# Patient Record
Sex: Male | Born: 1955 | Race: White | Hispanic: No | Marital: Married | Smoking: Former smoker
Health system: Southern US, Community
[De-identification: ages and names within clinical notes are randomized; demographics above are authoritative.]

## PROBLEM LIST (undated history)

## (undated) DIAGNOSIS — N400 Enlarged prostate without lower urinary tract symptoms: Secondary | ICD-10-CM

## (undated) DIAGNOSIS — E669 Obesity, unspecified: Secondary | ICD-10-CM

## (undated) DIAGNOSIS — J45909 Unspecified asthma, uncomplicated: Secondary | ICD-10-CM

## (undated) HISTORY — PX: NASAL SINUS SURGERY: SHX719

## (undated) HISTORY — DX: Obesity, unspecified: E66.9

## (undated) HISTORY — PX: HERNIA REPAIR: SHX51

## (undated) HISTORY — PX: CHOLECYSTECTOMY: SHX55

## (undated) HISTORY — DX: Unspecified asthma, uncomplicated: J45.909

## (undated) HISTORY — DX: Benign prostatic hyperplasia without lower urinary tract symptoms: N40.0

## (undated) HISTORY — PX: TRIGGER FINGER RELEASE: SHX641

---

## 2000-06-02 ENCOUNTER — Encounter: Payer: Self-pay | Admitting: Family Medicine

## 2000-06-02 ENCOUNTER — Ambulatory Visit (HOSPITAL_COMMUNITY): Admission: RE | Admit: 2000-06-02 | Discharge: 2000-06-02 | Payer: Self-pay | Admitting: Family Medicine

## 2001-01-23 ENCOUNTER — Encounter: Payer: Self-pay | Admitting: Otolaryngology

## 2001-01-23 ENCOUNTER — Encounter: Admission: RE | Admit: 2001-01-23 | Discharge: 2001-01-23 | Payer: Self-pay | Admitting: Otolaryngology

## 2001-02-12 ENCOUNTER — Other Ambulatory Visit: Admission: RE | Admit: 2001-02-12 | Discharge: 2001-02-12 | Payer: Self-pay | Admitting: Otolaryngology

## 2001-02-12 ENCOUNTER — Encounter (INDEPENDENT_AMBULATORY_CARE_PROVIDER_SITE_OTHER): Payer: Self-pay | Admitting: Specialist

## 2002-07-19 ENCOUNTER — Ambulatory Visit (HOSPITAL_COMMUNITY): Admission: RE | Admit: 2002-07-19 | Discharge: 2002-07-19 | Payer: Self-pay | Admitting: Family Medicine

## 2002-07-19 ENCOUNTER — Encounter: Payer: Self-pay | Admitting: Family Medicine

## 2002-07-28 ENCOUNTER — Encounter: Payer: Self-pay | Admitting: Neurosurgery

## 2002-07-28 ENCOUNTER — Inpatient Hospital Stay (HOSPITAL_COMMUNITY): Admission: RE | Admit: 2002-07-28 | Discharge: 2002-07-29 | Payer: Self-pay | Admitting: Neurosurgery

## 2003-02-16 ENCOUNTER — Encounter: Payer: Self-pay | Admitting: Urology

## 2003-02-16 ENCOUNTER — Ambulatory Visit (HOSPITAL_COMMUNITY): Admission: RE | Admit: 2003-02-16 | Discharge: 2003-02-16 | Payer: Self-pay | Admitting: Urology

## 2007-10-04 ENCOUNTER — Inpatient Hospital Stay (HOSPITAL_COMMUNITY): Admission: EM | Admit: 2007-10-04 | Discharge: 2007-10-07 | Payer: Self-pay | Admitting: *Deleted

## 2007-10-06 ENCOUNTER — Encounter (INDEPENDENT_AMBULATORY_CARE_PROVIDER_SITE_OTHER): Payer: Self-pay | Admitting: Surgery

## 2011-04-30 NOTE — Op Note (Signed)
NAMEJOJO, PEHL NO.:  [id number]   MEDICAL RECORD NO.:  [id number]          PATIENT TYPE:  INP   LOCATION:  5703                         FACILITY:  [facility]   PHYSICIAN:  Maisie Fus A. Cornett, M.D.DATE OF BIRTH:  [birth year]   DATE OF PROCEDURE:  10/06/2007  DATE OF DISCHARGE:                               OPERATIVE REPORT   PREOPERATIVE DIAGNOSIS:  Acute cholecystitis.   POSTOPERATIVE DIAGNOSIS:  Acute cholecystitis.   PROCEDURE:  Laparoscopic cholecystectomy with cholangiogram.   SURGEON:  Thomas A. Cornett, M.D.   ASSISTANT:  Adolph Pollack, M.D.   ANESTHESIA:  General endotracheal anesthesia, 0.25% Sensorcaine local.   ESTIMATED BLOOD LOSS:  150 mL.   DRAIN:  A 19-Blake drain to gallbladder fossa.   INDICATIONS FOR PROCEDURE:  The patient is a 55 year old male admitted  early Monday morning with acute cholecystitis.  He presents today for  laparoscopic cholecystectomy for acute cholecystitis.   DESCRIPTION OF PROCEDURE:  The patient is brought to the operating room  and placed supine.  After induction of general anesthesia, the abdomen  was prepped and draped in a sterile fashion.  A 1 cm infraumbilical  incision was made after infiltration with local anesthesia.  Dissection  was carried down to his fascia.  The fascia was opened with the scalpel  blade.  Kochers were used to grab the fascial edges, pulled them up and  a small hemostat was used to open the peritoneal lining.  A pursestring  suture of 0 Vicryl was placed and the 12 mm Hasson cannula was placed  under direct vision.  The pneumoperitoneum was created to 15 mmHg of CO2  and a laparoscope was placed.  Laparoscopy was performed.  He was placed  in the reverse Trendelenburg and rolled to his left.  There was dense  omental adhesions to the acutely inflamed gallbladder.  We were able to  identify the dome of the gallbladder, pull the omental adhesions away  and grasper was used to grab  the infundibulum and pull it to the  patient's right lower quadrant.  The cystic duct was identified,  dissected out circumferentially.  It was the only tubular structure  entering the gallbladder and the critical angle was achieved at this  point.  Two clips were placed in the gallbladder site and the duct was  then opened using scissors.  A cholangiogram catheter was introduced  through a separate stab wound, placed in the cystic duct to control  bile.  Intraoperative cholangiogram was performed which showed flow  through the cystic duct into the common bile duct and through the  duodenum without signs of obstruction.  There was free flow up the  common hepatic duct, which was short, and then bifurcation of right and  left hepatic ducts was noted.  No evidence of extravasation, stone or  leakage noted on cholangiogram.  The catheter was then removed.  The  cystic duct stump was triple clipped and divided.  The cystic artery was  taken in between clips as well.  There is a posterior branch that we  controlled with clips as  well from the same artery going into the  gallbladder.  We then used the cautery to dissect the gallbladder from  the gallbladder fossa.  The gallbladder was quite necrotic and began to  separate.  There was some spillage of stones.  I was able to suction  these under direct vision and any stone that we were able to see  directly and removed with the suction quite easily.  Once the entire  gallbladder was removed from the gallbladder fossa, it was placed in the  EndoCatch bag.  We then irrigated out the gallbladder fossa and used  cautery to control oozing, which was significant.  Surgicel was placed  in the gallbladder fossa.  Clips were on both the cystic artery and  cystic duct stump without signs of bleeding.  There was some oozing from  the omentum we controlled with electrocautery.  Next, we placed a 19  Blake drain due to the amount of inflammation and oozing  noted from the  gallbladder bed and the gallbladder fossa and pulled this out through  the most lateral 5-mm port.  Of note, a subxiphoid 10-mm port and two 5-  mm ports were placed that I placed under direct vision, the larger in  the subxiphoid and the two 5-mm in the right upper quadrant.  At this  point in time, the gallbladder was then placed in the EndoCatch bag and  extracted through the umbilicus.  Unfortunately, the bag broke as we got  the dilator in the subcutaneous tissues, but removed the camera looking  at it directly and no stones spilled down to the umbilicus.  We were  able then to carefully extract the gallbladder by opening our fascial  opening and removed the gallbladder without difficulty.  There was one  small retained clip, we removed it.  No evidence of any stones,  gallbladder or bag being left behind upon inspection with the  laparoscope and on direct vision.  Next, we suctioned out all excess  irrigation and found that hemostasis was excellent.  We then closed the  fascial defect with 0 Vicryl.  This was at the umbilicus.  We then  reexamined the right upper quadrant, suctioned out excess fluid, found  hemostasis continued to be excellent, the drain to be in good position  and we removed our ports and allowed the CO2 to escape.  Once all ports  were removed, we irrigated out the umbilicus and closed all incisions  with 4-0 Monocryl.  Dermabond was applied to all skin incisions.  All  final counts of sponge, needle and instruments found to be correct in  this portion of the case.  The patient was then awoke, taken to recovery  in satisfactory condition.      Thomas A. Cornett, M.D.  Electronically Signed     TAC/MEDQ  D:  10/06/2007  T:  10/06/2007  Job:  [id number]

## 2011-04-30 NOTE — H&P (Signed)
[name], NUNNERY NO.:  [id number]   MEDICAL RECORD NO.:  [id number]          PATIENT TYPE:  EMS   LOCATION:  MAJO                         FACILITY:  [facility]   PHYSICIAN:  Gabrielle Dare. Janee Morn, M.D.DATE OF BIRTH:  [birth year]   DATE OF ADMISSION:  10/04/2007  DATE OF DISCHARGE:                              HISTORY & PHYSICAL   CHIEF COMPLAINT:  Right upper quadrant abdominal pain.   HISTORY OF PRESENT ILLNESS:  [name] is a 55 year old gentleman who  developed acute onset of right upper quadrant abdominal pain around  midnight last night.  The pain persisted.  He has had similar episodes  in the past, though they were milder and lasted only a short period of  time.  This time the pain persisted despite him taking a hot shower.  He  had some associated dyspepsia.  He came to the [facility] Emergency  Department for further evaluation.  A workup here demonstrates elevated  white blood cell count and ultrasound findings consistent with  cholecystitis, and we are asked to evaluate.   PAST MEDICAL HISTORY:  1. GERD.  2. Seasonal allergies.   PAST SURGICAL HISTORY:  1. Left inguinal hernia repair in 1985.  2. L4-5 back surgery by Dr. Newell Coral in 2003.   FAMILY HISTORY:  Mother and sister have had gallbladder disease.   SOCIAL HISTORY:  He does not smoke.  He does not drink alcohol.  He  works with [employer].   ALLERGIES:  NO KNOWN DRUG ALLERGIES.   MEDICATIONS:  Allegra and Mucinex.   REVIEW OF SYSTEMS:  Significant for the GI system as above.  Remainder  of the review of the systems was noncontributory.   PHYSICAL EXAM:  VITAL SIGNS:  Temperature 98.4, pulse 71, respirations  18, blood pressure 138/94.  GENERAL:  He is awake and alert.  He is in no distress.  HEENT:  Pupils are equal and reactive.  Sclerae is clear with no  icterus.  Oral mucosa is moist.  NECK:  Supple with no tenderness.  LUNGS:  Clear to auscultation.  RESPIRATORY:  Excursion is  good.  CARDIOVASCULAR EXAM:  Heart is regular.  No murmurs are heard.  Pulse is  palpable in the left chest.  ABDOMEN:  Moderate tenderness to palpation in the right upper quadrant.  There is no guarding.  Bowel sounds are present.  No masses are felt.  Inguinal exam reveals no hernias at this time.  EXTREMITIES:  Warm with no deformity.  SKIN:  Warm and dry.  No rashes are present.  NEUROLOGIC EXAM:  There is no focal deficits.   LABORATORY STUDIES AND X-RAYS:  Ultrasound of gallbladder shows  gallstones, gallbladder wall thickening, and some pericholecystic fluid  consistent with acute cholecystitis.  White blood cell count 22.7,  hemoglobin 15.8, platelets 251.  Lipase 17, AST 28, ALT 33, alkaline  phosphatase 85, bilirubin 0.8.  Sodium 135, potassium 3.2, chloride 100,  CO2 of 25, BUN 4, creatinine 0.96, glucose 106.  Urinalysis negative.   IMPRESSION:  1. Acute cholecystitis.  2. Hypokalemia.   PLAN:  Admit him.  We will give him IV fluids and IV antibiotics.  We  will replace his potassium and plan laparoscopic cholecystectomy this  admission.      Gabrielle Dare Janee Morn, M.D.  Electronically Signed     BET/MEDQ  D:  10/04/2007  T:  10/05/2007  Job:  [id number]

## 2011-05-03 NOTE — Discharge Summary (Signed)
NAMEDONTAI, [name] NO.:  [id number]   MEDICAL RECORD NO.:  [id number]          PATIENT TYPE:  INP   LOCATION:  5703                         FACILITY:  [facility]   PHYSICIAN:  [name], M.D.DATE OF BIRTH:  1956/10/01   DATE OF ADMISSION:  10/04/2007  DATE OF DISCHARGE:  10/07/2007                               DISCHARGE SUMMARY   DISCHARGING PHYSICIAN:  [name], M.D.   CHIEF COMPLAINT AND REASON FOR ADMISSION:  This is a 50-year male  patient with a history of gastrointestinal reflux disease who developed  the acute onset of right upper quadrant abdominal pain the night before  he presented to the ER.  He has had prior episodes in the past, but not  as severe as this episode.  He presented to the [facility] ER where he was  found to be afebrile on clinical exam and he was tender in the right  upper quadrant concerning for possible cholecystitis.  His white blood  cell count was elevated at 22,700.  Lipase was 17.  LFTs were normal  with a normal total bilirubin 0.8.  An ultrasound was performed that  showed gallstones, gallbladder wall thickening and pericholecystic fluid  consistent with acute cholecystitis.   ADMITTING DIAGNOSES:  The patient subsequently admitted with the  following diagnoses.  1. Acute cholecystitis.  2. Hypokalemia with a potassium of 3.2 secondary to nausea and      vomiting.   HOSPITAL COURSE:  The patient was admitted and was taken directly from  the ER to the OR where he subsequently underwent a laparoscopic  cholecystectomy by Dr. Marca Singh.  The patient tolerated procedure  well.   By postoperative day #1, on October 07, 2007, he was tolerating full  liquids with plans to advance his diet advance oral pain medications;  and, if this was tolerated we would send the patient home.  He did have  a JP drain placed because of bleeding and the intraoperative procedure,  noting that the OR procedure was done on October 06, 2007.  The JP drain  remained in place and the patient was sent home.  The patient did  tolerate diet and oral pain medication was advanced; and he was  otherwise deemed appropriate for discharge home.   DISCHARGE DIAGNOSES:  1. Abdominal pain in the right upper quadrant secondary to acute      cholecystitis without obstructive pattern.  2. Status post laparoscopic cholecystectomy.   DISCHARGE MEDICATIONS:  The patient will resume the following home  medications.  1. Allegra twice a day.  2. Optiva eye drops.   New medications include:  1. Percocet 5/325 one to two tabs every 4 hours as needed for pain.  2. Over-the-counter ibuprofen 2-3 tablets every 8 hours as needed in      addition to the Percocet for pain; please take with food.   WOUND CARE:  The patient is to empty the JP drain two to three times  daily.  He is to record the amount of bring this documentation to follow  up with Dr. Marca Singh.  RETURN TO WORK:  The patient may return to work in 2 weeks; note given   DIET:  No restrictions.   ACTIVITY:  Increase activity slowly.  He may walk up steps and sponge  bathe while the drain is in place.  No lifting greater than 15 pounds  for the next 2 weeks.  No driving for 1 week.   FOLLOW-UP APPOINTMENTS:  The patient has an appointment with Dr. Luisa Singh  on October 12, 2007 2:30 P.M.   ADDITIONAL INSTRUCTIONS:  The patient is to call the surgeon if he  develops a fever greater than 101 degrees Fahrenheit, had new or  increased belly pain, redness or drainage from his wounds, nausea,  vomiting, or diarrhea.      [name], N.P.      [name], M.D.  Electronically Signed    ALE/MEDQ  D:  11/04/2007  T:  11/05/2007  Job:  [id number]

## 2011-05-03 NOTE — Op Note (Signed)
NAME:  [name], PIENTA                         ACCOUNT NO.:  [id number]   MEDICAL RECORD NO.:  [id number]                   PATIENT TYPE:  INP   LOCATION:  3019                                 FACILITY:  [facility]   PHYSICIAN:  Hewitt Shorts, M.D.            DATE OF BIRTH:  1956-07-24   DATE OF PROCEDURE:  07/28/2002  DATE OF DISCHARGE:  07/29/2002                                 OPERATIVE REPORT   PREOPERATIVE DIAGNOSES:  Left L4-L5 extraforaminal lumbar disc.   POSTOPERATIVE DIAGNOSES:  Left L4-L5 extraforaminal lumbar disc.   PROCEDURE:  Left L4-L5 extraforaminal micro-discectomy.   SURGEON:  Hewitt Shorts, M.D.   ASSISTANT:  Danae Orleans. Venetia Maxon, M.D.   ANESTHESIA:  General endotracheal anesthesia.   INDICATIONS:  The patient is a 55 year old man who presents with an acute  left lumbar radiculopathy.  He was found to have large left L4-L5  extraforaminal disc herniation superimposed upon underlying degenerative  disc disease with spondylosis.   DESCRIPTION OF PROCEDURE:  The patient was brought to the operating room and  placed under general endotracheal anesthesia.  The patient was turned to a  prone position and the lumbar region was prepped with Betadine soap and  solution and draped in sterile fashion.  An x-ray was taken and the L4-L5  level identified.  The midline was infiltrated with local anesthetic with  epinephrine and then a midline incision was made and carried down through  the subcutaneous tissue.  Bipolar cautery was used to obtain hemostasis.  Dissection was carried down to the lumbar fascia which was incised from the  left side of the midline and the paraspinal muscles were dissected to the  spinous processes and lamina in a subperiosteal fashion.  Another x-ray was  taken and the L4-L5 level identified.  We then dissected over the left L4-L5  facet complex and into the left extra-foraminal space.  Another x-ray was  taken and the L4-L5 level once  again confirmed.  We then performed a lateral  facetectomy and dissected down into the left L4-L5 extraforaminal space.  We  explored this area and encountered disc fragment which was removed.  We then  further explored it and identified the left L4 nerve roots.  There were  further fragments inferomedial to the nerve root.  There was significant  spondylosis from the inferior aspect of the L4 vertebral body that was  removed using the osteophyte removal tool as well as osteotomes.  We were  thereby able to further decompress the left L4 nerve root.  We examined the  nerve root medially, laterally, ventrally and dorsally and it was  decompressed in all planes.  We then examined the disc space and the  foramen.  A few additional fragments of disc material were removed from  within the foramen and it was felt that the foramen had been decompressed.  The left L4 nerve root had  been decompressed and all those fragments of disc  material had been removed from both the disc space and the extra-foraminal  space.  In examining the patient's MRI scan he did have some lateral recess  stenosis on the left side at L4-L5.  However, his syndrome was that of an  acute radiculopathy and was felt therefore to be due to the disc herniation  rather than the chronic encroachment due to the lateral recess stenosis.  Therefore, we elected not to perform a foraminotomy due to the increased  scarring, post-surgical changes and instability that could result.  In other  words, it was felt that the acute cause of his radiculopathy had been  relieved and therefore, no further intervention pursued.   We established hemostasis with the use of bipolar cautery as well as Gelfoam  soaked in thrombin. All the Gelfoam was removed prior to closure.  Once  hemostasis was established we instilled 2 cc of Fentanyl and 80 mg of Depo-  Medrol into the extra-foraminal space around the nerve root and the dorsal  root ganglion and  then proceeded with closure.  The deep fascia was closed  with interrupted, undyed #1 Vicryl suture, the subcutaneous layer was closed  with interrupted inverted undyed #1 Vicryl sutures, the subcutaneous and  subcuticular closure was done with interrupted inverted 2-0 Vicryl undyed  sutures and the skin was reapproximated with Dermabond.  The patient  tolerated the procedure well and the estimated blood loss was less than 50  cc. Sponge count correct.  Following surgery is to be turned back to the  supine position, reversed from anesthetic, extubated and transferred to the  recovery room for further care.                                                Hewitt Shorts, M.D.    RWN/MEDQ  D:  07/28/2002  T:  07/30/2002  Job:  [id number]

## 2011-09-25 LAB — CBC
HCT: 40.7
HCT: 43.5
Hemoglobin: 13.9
MCHC: 33.9
MCHC: 34.3
MCV: 93.3
Platelets: 190
Platelets: 251
RBC: 4.66
RDW: 12.8
WBC: 15.5 — ABNORMAL HIGH

## 2011-09-25 LAB — DIFFERENTIAL
Basophils Absolute: 0.1
Lymphocytes Relative: 14
Lymphs Abs: 3.2
Monocytes Relative: 3
Neutro Abs: 18.6 — ABNORMAL HIGH

## 2011-09-25 LAB — COMPREHENSIVE METABOLIC PANEL
ALT: 26
AST: 26
Albumin: 3.1 — ABNORMAL LOW
Albumin: 3.7
BUN: 4 — ABNORMAL LOW
BUN: 5 — ABNORMAL LOW
CO2: 27
Calcium: 8.8
GFR calc non Af Amer: >60
Glucose, Bld: 106 — ABNORMAL HIGH
Potassium: 3.2 — ABNORMAL LOW
Potassium: 4
Sodium: 135
Sodium: 137
Total Bilirubin: 2.1 — ABNORMAL HIGH
Total Protein: 6

## 2011-09-25 LAB — URINALYSIS, ROUTINE W REFLEX MICROSCOPIC
Protein, ur: NEGATIVE
Specific Gravity, Urine: 1.005
pH: 6.5

## 2011-09-25 LAB — BASIC METABOLIC PANEL
Creatinine, Ser: 1.16
GFR calc Af Amer: >60
Potassium: 4.2
Sodium: 140

## 2013-03-13 ENCOUNTER — Ambulatory Visit (INDEPENDENT_AMBULATORY_CARE_PROVIDER_SITE_OTHER): Payer: BC Managed Care – PPO | Admitting: Physician Assistant

## 2013-03-13 ENCOUNTER — Encounter: Payer: Self-pay | Admitting: Physician Assistant

## 2013-03-13 VITALS — BP 132/91 | HR 90 | Temp 99.3°F | Ht 71.0 in | Wt 278.0 lb

## 2013-03-13 DIAGNOSIS — J209 Acute bronchitis, unspecified: Secondary | ICD-10-CM

## 2013-03-13 MED ORDER — HYDROCODONE-HOMATROPINE 5-1.5 MG/5ML PO SYRP: 5.0000 mL | ORAL_SOLUTION | Freq: Every evening | ORAL | Status: DC | PRN

## 2013-03-13 MED ORDER — AMOXICILLIN 875 MG PO TABS: 875.0000 mg | ORAL_TABLET | Freq: Two times a day (BID) | ORAL | Status: DC

## 2013-03-15 NOTE — Progress Notes (Signed)
  Subjective:    Patient ID: [name], male    DOB: 03/02/1956, 57 y.o.   MRN: [id number]  HPI Productive cough, prevents sleep; sx 3 days   Review of Systems  HENT: Positive for congestion, postnasal drip and sinus pressure.   Respiratory: Positive for cough.   All other systems reviewed and are negative.       Objective:   Physical Exam  Vitals reviewed. Constitutional: He is oriented to person, place, and time. He appears well-developed and well-nourished.  HENT:  Head: Normocephalic and atraumatic.  Right Ear: External ear normal.  Left Ear: External ear normal.  Mouth/Throat: Oropharynx is clear and moist.  Nasal hypertrophy  Eyes: Conjunctivae and EOM are normal. Pupils are equal, round, and reactive to light. Left eye exhibits discharge.  Neck: Normal range of motion. Neck supple.  Cardiovascular: Normal rate, regular rhythm and normal heart sounds.   Pulmonary/Chest: Effort normal.  crackles  Neurological: He is alert and oriented to person, place, and time.  Psychiatric: He has a normal mood and affect. His behavior is normal. Judgment and thought content normal.          Assessment & Plan:  Acute bronchitis - Plan: amoxicillin (AMOXIL) 875 MG tablet, HYDROcodone-homatropine (HYCODAN) 5-1.5 MG/5ML syrup

## 2013-09-27 ENCOUNTER — Ambulatory Visit (INDEPENDENT_AMBULATORY_CARE_PROVIDER_SITE_OTHER): Payer: BC Managed Care – PPO | Admitting: Family Medicine

## 2013-09-27 ENCOUNTER — Encounter (INDEPENDENT_AMBULATORY_CARE_PROVIDER_SITE_OTHER): Payer: Self-pay

## 2013-09-27 VITALS — BP 133/80 | HR 85 | Temp 96.4°F | Wt 271.0 lb

## 2013-09-27 DIAGNOSIS — N400 Enlarged prostate without lower urinary tract symptoms: Secondary | ICD-10-CM | POA: Insufficient documentation

## 2013-09-27 DIAGNOSIS — J209 Acute bronchitis, unspecified: Secondary | ICD-10-CM

## 2013-09-27 DIAGNOSIS — R351 Nocturia: Secondary | ICD-10-CM | POA: Insufficient documentation

## 2013-09-27 DIAGNOSIS — E669 Obesity, unspecified: Secondary | ICD-10-CM | POA: Insufficient documentation

## 2013-09-27 DIAGNOSIS — Z13220 Encounter for screening for lipoid disorders: Secondary | ICD-10-CM | POA: Insufficient documentation

## 2013-09-27 DIAGNOSIS — J45909 Unspecified asthma, uncomplicated: Secondary | ICD-10-CM | POA: Insufficient documentation

## 2013-09-27 MED ORDER — PREDNISONE 20 MG PO TABS: 40.0000 mg | ORAL_TABLET | Freq: Every day | ORAL | Status: DC

## 2013-09-27 MED ORDER — MONTELUKAST SODIUM 10 MG PO TABS: 10.0000 mg | ORAL_TABLET | Freq: Every day | ORAL | Status: DC

## 2013-09-27 NOTE — Patient Instructions (Signed)
Asthma Attack Prevention HOW CAN ASTHMA BE PREVENTED? Currently, there is no way to prevent asthma from starting. However, you can take steps to control the disease and prevent its symptoms after you have been diagnosed. Learn about your asthma and how to control it. Take an active role to control your asthma by working with your caregiver to create and follow an asthma action plan. An asthma action plan guides you in taking your medicines properly, avoiding factors that make your asthma worse, tracking your level of asthma control, responding to worsening asthma, and seeking emergency care when needed. To track your asthma, keep records of your symptoms, check your peak flow number using a peak flow meter (handheld device that shows how well air moves out of your lungs), and get regular asthma checkups.  Other ways to prevent asthma attacks include:  Use medicines as your caregiver directs.  Identify and avoid things that make your asthma worse (as much as you can).  Keep track of your asthma symptoms and level of control.  Get regular checkups for your asthma.  With your caregiver, write a detailed plan for taking medicines and managing an asthma attack. Then be sure to follow your action plan. Asthma is an ongoing condition that needs regular monitoring and treatment.  Identify and avoid asthma triggers. A number of outdoor allergens and irritants (pollen, mold, cold air, air pollution) can trigger asthma attacks. Find out what causes or makes your asthma worse, and take steps to avoid those triggers.  Monitor your breathing. Learn to recognize warning signs of an attack, such as slight coughing, wheezing or shortness of breath. However, your lung function may already decrease before you notice any signs or symptoms, so regularly measure and record your peak airflow with a home peak flow meter.  Identify and treat attacks early. If you act quickly, you're less likely to have a severe attack.  You will also need less medicine to control your symptoms. When your peak flow measurements decrease and alert you to an upcoming attack, take your medicine as instructed, and immediately stop any activity that may have triggered the attack. If your symptoms do not improve, get medical help.  Pay attention to increasing quick-relief inhaler use. If you find yourself relying on your quick-relief inhaler (such as albuterol), your asthma is not under control. See your caregiver about adjusting your treatment. IDENTIFY AND CONTROL FACTORS THAT MAKE YOUR ASTHMA WORSE A number of common things can set off or make your asthma symptoms worse (asthma triggers). Keep track of your asthma symptoms for several weeks, detailing all the environmental and emotional factors that are linked with your asthma. When you have an asthma attack, go back to your asthma diary to see which factor, or combination of factors, might have contributed to it. Once you know what these factors are, you can take steps to control many of them.  Allergies: If you have allergies and asthma, it is important to take asthma prevention steps at home. Asthma attacks (worsening of asthma symptoms) can be triggered by allergies, which can cause temporary increased inflammation of your airways. Minimizing contact with the substance to which you are allergic will help prevent an asthma attack. Animal Dander:   Some people are allergic to the flakes of skin or dried saliva from animals with fur or feathers. Keep these pets out of your home.  If you can't keep a pet outdoors, keep the pet out of your bedroom and other sleeping areas at all times,  and keep the door closed.  Remove carpets and furniture covered with cloth from your home. If that is not possible, keep the pet away from fabric-covered furniture and carpets. Dust Mites:  Many people with asthma are allergic to dust mites. Dust mites are tiny bugs that are found in every home, in  mattresses, pillows, carpets, fabric-covered furniture, bedcovers, clothes, stuffed toys, fabric, and other fabric-covered items.  Cover your mattress in a special dust-proof cover.  Cover your pillow in a special dust-proof cover, or wash the pillow each week in hot water. Water must be hotter than 130 F to kill dust mites. Cold or warm water used with detergent and bleach can also be effective.  Wash the sheets and blankets on your bed each week in hot water.  Try not to sleep or lie on cloth-covered cushions.  Call ahead when traveling and ask for a smoke-free hotel room. Bring your own bedding and pillows, in case the hotel only supplies feather pillows and down comforters, which may contain dust mites and cause asthma symptoms.  Remove carpets from your bedroom and those laid on concrete, if you can.  Keep stuffed toys out of the bed, or wash the toys weekly in hot water or cooler water with detergent and bleach. Cockroaches:  Many people with asthma are allergic to the droppings and remains of cockroaches.  Keep food and garbage in closed containers. Never leave food out.  Use poison baits, traps, powders, gels, or paste (for example, boric acid).  If a spray is used to kill cockroaches, stay out of the room until the odor goes away. Indoor Mold:  Fix leaky faucets, pipes, or other sources of water that have mold around them.  Clean floors and moldy surfaces with a fungicide or diluted bleach.  Avoid using humidifiers, vaporizers, or swamp coolers. These can spread molds through the air. Pollen and Outdoor Mold:  When pollen or mold spore counts are high, try to keep your windows closed.  Stay indoors with windows closed from late morning to afternoon, if you can. Pollen and some mold spore counts are highest at that time.  Ask your caregiver whether you need to take or increase anti-inflammatory medicine before your allergy season starts. Irritants:   Tobacco smoke is  an irritant. If you smoke, ask your caregiver how you can quit. Ask family members to quit smoking, too. Do not allow smoking in your home or car.  If possible, do not use a wood-burning stove, kerosene heater, or fireplace. Minimize exposure to all sources of smoke, including incense, candles, fires, and fireworks.  Try to stay away from strong odors and sprays, such as perfume, talcum powder, hair spray, and paints.  Decrease humidity in your home and use an indoor air cleaning device. Reduce indoor humidity to below 60 percent. Dehumidifiers or central air conditioners can do this.  Decrease house dust exposure by changing furnace and air cooler filters frequently.  Try to have someone else vacuum for you once or twice a week, if you can. Stay out of rooms while they are being vacuumed and for a short while afterward.  If you vacuum, use a dust mask from a hardware store, a double-layered or microfilter vacuum cleaner bag, or a vacuum cleaner with a HEPA filter.  Sulfites in foods and beverages can be irritants. Do not drink beer or wine, or eat dried fruit, processed potatoes, or shrimp if they cause asthma symptoms.  Cold air can trigger an asthma attack.  Cover your nose and mouth with a scarf on cold or windy days.  Several health conditions can make asthma more difficult to manage, including runny nose, sinus infections, reflux disease, psychological stress, and sleep apnea. Your caregiver will treat these conditions, as well.  Avoid close contact with people who have a cold or the flu, since your asthma symptoms may get worse if you catch the infection from them. Wash your hands thoroughly after touching items that may have been handled by people with a respiratory infection.  Get a flu shot every year to protect against the flu virus, which often makes asthma worse for days or weeks. Also get a pneumonia shot once every 5 10 years. Medicines:  Aspirin and other pain relievers can  cause asthma attacks. Ten percent to 20% of people with asthma have sensitivity to aspirin or a group of pain relievers called non-steroidal anti-inflammatory medicines (NSAIDS), such as ibuprofen and naproxen. These medicines are used to treat pain and reduce fevers. Asthma attacks caused by any of these medicines can be severe and even fatal. These medicines must be avoided in people who have known aspirin sensitive asthma. Products with acetaminophen are considered safe for people who have asthma. It is important that people with aspirin sensitivity read labels of all over-the-counter medicines used to treat pain, colds, coughs, and fever.  Beta blockers and ACE inhibitors are other medicines which you should discuss with your caregiver, in relation to your asthma. ALLERGY SKIN TESTING  Ask your asthma caregiver about allergy skin testing or blood testing (RAST test) to identify the allergens to which you are sensitive. If you are found to have allergies, allergy shots (immunotherapy) for asthma may help prevent future allergies and asthma. With allergy shots, small doses of allergens (substances to which you are allergic) are injected under your skin on a regular schedule. Over a period of time, your body may become used to the allergen and less responsive with asthma symptoms. You can also take measures to minimize your exposure to those allergens. EXERCISE  If you have exercise-induced asthma, or are planning vigorous exercise, or exercise in cold, humid, or dry environments, prevent exercise-induced asthma by following your caregiver's advice regarding asthma treatment before exercising. Document Released: 11/20/2009 Document Revised: 11/18/2012 Document Reviewed: 11/20/2009 [facility] Patient Information 2014 [place], [place].  Asthma, Adult Asthma is a condition that affects your lungs. It is characterized by swelling and narrowing of your airways as well as increased mucus production. The narrowing  comes from swelling and muscle spasms inside the airways. When this happens, breathing can be difficult and you can have coughing, wheezing, and shortness of breath. Knowing more about asthma can help you manage it better. Asthma cannot be cured, but medicines and lifestyle changes can help control it. Asthma can be a minor problem for some people but if it is not controlled it can lead to a life-threatening asthma attack. Asthma can change over time. It is important to work with your caregiver to manage your asthma symptoms. CAUSES The exact cause of asthma is unknown. Asthma is believed to be caused by inherited (genetic) and environmental exposures. Swelling and redness (inflammation) of the airways occurs in asthma. This can be triggered by allergies, viral lung infections, or irritants in the air. Allergic reactions can cause you to wheeze immediately or several hours after an exposure. Asthma triggers are different for each person. It is important to pay attention and know what triggers your asthma.  Common triggers for asthma  attacks include:  Animal dander from the skin, hair, or feathers of animals.  Dust mites contained in house dust.  Cockroaches.  Pollen from trees or grass.  Mold.  Cigarette or tobacco smoke. Smoking cannot be allowed in homes of people with asthma. People with asthma should not smoke and should not be around smokers.  Air pollutants such as dust, household cleaners, hair sprays, aerosol sprays, paint fumes, strong chemicals, or strong odors.  Cold air or weather changes. Cold air may cause inflammation. Winds increase molds and pollens in the air. There is not one best climate for people with asthma.  Strong emotions such as crying or laughing hard.  Stress.  Certain medicines such as aspirin or beta-blockers.  Sulfites in such foods and drinks as dried fruits and wine.  Infections or inflammatory conditions such as the flu, a cold, or an inflammation of  the nasal membranes (rhinitis).  Gastroesophageal reflux disease (GERD). GERD is a condition where stomach acid backs up into your throat (esophagus).  Exercise or strenous activity. Proper pre-exercise medicines allow most people to participate in sports. SYMPTOMS  Feeling short of breath.  Chest tightness or pain.  Difficulty sleeping due to coughing, wheezing, or feeling short of breath.  A whistling or wheezing sound with exhalation.  Coughing or wheezing that is worse when you:  Have a virus (such as a cold or the flu).  Are suffering from allergies.  Are exposed to certain fumes or chemicals.  Exercise. Signs that your asthma is probably getting worse include:   More frequent and bothersome asthma signs and symptoms.  Increasing difficulty breathing. This can be measured by a peak flow meter, which is a simple device used to check how well your lungs are working.  An increasingly frequent need to use a quick-relief inhaler. DIAGNOSIS  The diagnosis of asthma is made by review of your medical history, a physical exam, and possibly from other tests. Lung function studies may help with the diagnosis. TREATMENT  Asthma cannot be cured. However, for the majority of adults, asthma can be controlled with treatment. Besides avoidance of triggers of your asthma, medicines are often required. There are 2 classes of medicine used for asthma treatment: controller medicines (reduce inflammation and symptoms) andreliever or rescue medicines (relieve asthma symptoms during acute attacks). You may require daily medicines to control your asthma. The most effective long-term controller medicines for asthma are inhaled corticosteroids (blocks inflammation). Other long-term control medicines include:  Leukotriene receptor antagonists (blocks a pathway of inflammation).  Long-acting beta2-agonists (relaxes the muscles of the airways for at least 12 hours) with an inhaled  corticosteroid.  Cromolyn sodium or nedocromil (alters certain inflammatory cells' ability to release chemicals that cause inflammation).  Immunomodulators (alters the immune system to prevent asthma symptoms).  Theophylline (relaxes muscles in the airways). You may also require a short-acting beta2-agonist to relieve asthma symptoms during an acute attack. You should understand what to do during an acute attack. Inhaled medicines are effective when used properly. Read the instructions on how to use your medicines correctly and speak to your caregiver if you have questions. Follow up with your caregiver on a regular basis to make sure your asthma is well-controlled. If your asthma is not well-controlled, if you have been hospitalized for asthma, or if multiple medicines or medium to high doses of inhaled corticosteroids are needed to control your asthma, request a referral to an asthma specialist. HOME CARE INSTRUCTIONS   Take medicines as directed by your  caregiver.  Control your home environment in the following ways to help prevent asthma attacks:  Change your heating and air conditioning filter at least once a month.  Place a filter or cheesecloth over your heating and air conditioning vents.  Limit the use of fireplaces and wood stoves.  Do not smoke. Do not stay in places where others are smoking.  Get rid of pests (such as roaches and mice) and their droppings.  If you see mold on a plant, throw it away.  Clean your floors and dust every week. Use unscented cleaning products. Use a vacuum cleaner with a HEPA filter if possible. If vacuuming or cleaning triggers your asthma, try to find someone else to do these chores.  Floors in your house should be wood, tile, or vinyl. Carpet can trap dander and dust.  Use allergy-proof pillows, mattress covers, and box spring covers.  Wash bedsheets and blankets every week in hot water and dry in a dryer.  Use a blanket that is made of  polyester or cotton with a tight nap.  Do not use a dust ruffle on your bed.  Clean bathrooms and kitchens with bleach and repaint with mold-resistant paint.  Wash hands frequently.  Talk to your caregiver about an action plan for managing asthma attacks. This includes the use of a peak flow meter which measures the severity of the attack and medicines that can help stop the attack. An action plan can help minimize or stop the attack without having to seek medical care.  Remain calm during an asthma attack.  Always have a plan prepared for seeking medical attention. This should include contacting your caregiver and in the case of a severe attack, calling your local emergency services (911 in [place] SEEK MEDICAL CARE IF:   You have wheezing, shortness of breath, or a cough even if taking medicine to prevent attacks.  You have thickening of sputum.  Your sputum changes from clear or white to yellow, green, gray, or bloody.  You have any problems that may be related to the medicines you are taking (such as a rash, itching, swelling, or trouble breathing).  You are using a reliever medicine more than 2 3 times per week.  Your peak flow is still at 50 79% of personal best after following your action plan for 1 hour. SEEK IMMEDIATE MEDICAL CARE IF:   You are short of breath even at rest.  You get short of breath when doing very little physical activity.  You have difficulty eating, drinking, or talking due to asthma symptoms.  You have chest pain or you feel that your heart is beating fast.  You have a bluish color to your lips or fingernails.  You are lightheaded, dizzy, or faint.  You have a fever or persistent symptoms for more than 2 3 days.  You have a fever and symptoms suddenly get worse.  You seem to be getting worse and are unresponsive to treatment during an asthma attack.  Your peak flow is less than 50% of personal best. MAKE SURE YOU:   Understand these  instructions.  Will watch your condition.  Will get help right away if you are not doing well or get worse. Document Released: 12/02/2005 Document Revised: 11/18/2012 Document Reviewed: 07/20/2008 [facility] Patient Information 2014 [place], [place].

## 2013-09-27 NOTE — Progress Notes (Signed)
Patient ID: [name], male   DOB: 27-Apr-1956, 57 y.o.   MRN: [id number] SUBJECTIVE: CC: Chief Complaint  Patient presents with  . sinus drainage    taking doxy and benzonate  . rib pain with coughing    HPI: Since Thursday has had sinus drainage. Came late on Saturday and the office was closed. He had to go to the Urgent Care. Has a cough and wheeze. Has had a h/o asthma.no chest pain. No pedal edema. Ex-smoker. Stopped 15 years.  Past Medical History  Diagnosis Date  . Asthma   . BPH (benign prostatic hyperplasia)   . Obesity    Past Surgical History  Procedure Laterality Date  . Hernia repair    . Cholecystectomy     History   Social History  . Marital Status: Married    Spouse Name: N/A    Number of Children: N/A  . Years of Education: N/A   Occupational History  . Not on file.   Social History Main Topics  . Smoking status: Former Games developer  . Smokeless tobacco: Not on file  . Alcohol Use: No  . Drug Use: No  . Sexual Activity: Yes   Other Topics Concern  . Not on file   Social History Narrative  . No narrative on file   History reviewed. No pertinent family history. Current Outpatient Prescriptions on File Prior to Visit  Medication Sig Dispense Refill  . budesonide-formoterol (SYMBICORT) 80-4.5 MCG/ACT inhaler Inhale 2 puffs into the lungs 2 (two) times daily.       No current facility-administered medications on file prior to visit.   No Known Allergies  There is no immunization history on file for this patient. Prior to Admission medications   Medication Sig Start Date End Date Taking? Authorizing Provider  albuterol (PROVENTIL HFA;VENTOLIN HFA) 108 (90 BASE) MCG/ACT inhaler Inhale 2 puffs into the lungs every 6 (six) hours as needed for wheezing.   Yes Historical Provider, MD  benzonatate (TESSALON) 100 MG capsule Take 100 mg by mouth 3 (three) times daily as needed for cough.   Yes Historical Provider, MD  cetirizine (ZYRTEC) 10 MG tablet  Take 10 mg by mouth daily.   Yes Historical Provider, MD  doxycycline (DORYX) 100 MG EC tablet Take 100 mg by mouth 2 (two) times daily.   Yes Historical Provider, MD  budesonide-formoterol (SYMBICORT) 80-4.5 MCG/ACT inhaler Inhale 2 puffs into the lungs 2 (two) times daily.    Historical Provider, MD    ROS: As above in the HPI. All other systems are stable or negative.  OBJECTIVE: APPEARANCE:  Patient in no acute distress.The patient appeared well nourished and normally developed. Acyanotic. Waist: VITAL SIGNS:BP 133/80  Pulse 85  Temp(Src) 96.4 F (35.8 C) (Oral)  Wt 271 lb (122.925 kg)  BMI 37.81 kg/m2  SpO2 93% Obese WM   SKIN: warm and  Dry without overt rashes, tattoos and scars  HEAD and Neck: without JVD, Head and scalp: normal Eyes:No scleral icterus. Fundi normal, eye movements normal. Ears: Auricle normal, canal normal, Tympanic membranes normal, insufflation normal. Nose: nasal congestion , postnasal drip Throat: normal Neck & thyroid: normal  CHEST & LUNGS: Chest wall: normal Lungs: prolonged expiratory phase with expiratory wheezes. Air entry equal.corase breath sounds with scatterred rhonchi. No Rales.  CVS: Reveals the PMI to be normally located. Regular rhythm, First and Second Heart sounds are normal,  absence of murmurs, rubs or gallops. Peripheral vasculature: Radial pulses: normal Dorsal pedis pulses: normal Posterior  pulses: normal  ABDOMEN:  Appearance: Obese Benign, no organomegaly, no masses, no Abdominal Aortic enlargement. No Guarding , no rebound. No Bruits. Bowel sounds: normal  RECTAL: N/A GU: N/A  EXTREMETIES: nonedematous.  NEUROLOGIC: oriented to time,place and person; nonfocal.  ASSESSMENT: Bronchitis with asthma, acute  Asthma - Plan: predniSONE (DELTASONE) 20 MG tablet, montelukast (SINGULAIR) 10 MG tablet  BPH (benign prostatic hyperplasia)  Obesity - Plan: CMP14+EGFR  Nocturia - Plan: CMP14+EGFR  Screening  cholesterol level - Plan: Lipid panel  PLAN: Orders Placed This Encounter  Procedures  . CMP14+EGFR  . Lipid panel    Meds ordered this encounter  Medications  . cetirizine (ZYRTEC) 10 MG tablet    Sig: Take 10 mg by mouth daily.  Marland Kitchen albuterol (PROVENTIL HFA;VENTOLIN HFA) 108 (90 BASE) MCG/ACT inhaler    Sig: Inhale 2 puffs into the lungs every 6 (six) hours as needed for wheezing.  Marland Kitchen doxycycline (DORYX) 100 MG EC tablet    Sig: Take 100 mg by mouth 2 (two) times daily.  . benzonatate (TESSALON) 100 MG capsule    Sig: Take 100 mg by mouth 3 (three) times daily as needed for cough.  . predniSONE (DELTASONE) 20 MG tablet    Sig: Take 2 tablets (40 mg total) by mouth daily. For 3 days then 1 tablet daily for 2 days, then 1/2 tablet daily for 2 days    Dispense:  9 tablet    Refill:  0  . montelukast (SINGULAIR) 10 MG tablet    Sig: Take 1 tablet (10 mg total) by mouth at bedtime.    Dispense:  30 tablet    Refill:  3    Medications Discontinued During This Encounter  Medication Reason  . amoxicillin (AMOXIL) 875 MG tablet Completed Course  . loratadine (CLARITIN) 10 MG tablet Completed Course  . HYDROcodone-homatropine (HYCODAN) 5-1.5 MG/5ML syrup Completed Course   Handouts in the AVS.  counselled on Obesity and asthma and FHx of DM  Use of meds and rescue meds in asthma counselled.  Return in about 4 days (around 10/01/2013) for Recheck medical problems.  Margart Zemanek P. [place]esto Charon, M.D.

## 2013-09-28 LAB — CMP14+EGFR
ALT: 70 IU/L — ABNORMAL HIGH (ref 0–44)
AST: 58 IU/L — ABNORMAL HIGH (ref 0–40)
Albumin/Globulin Ratio: 1.4 (ref 1.1–2.5)
Albumin: 3.9 g/dL (ref 3.5–5.5)
Alkaline Phosphatase: 86 IU/L (ref 39–117)
BUN/Creatinine Ratio: 4 — ABNORMAL LOW (ref 9–20)
BUN: 5 mg/dL — ABNORMAL LOW (ref 6–24)
CO2: 24 mmol/L (ref 18–29)
Calcium: 8.9 mg/dL (ref 8.7–10.2)
Chloride: 101 mmol/L (ref 97–108)
Creatinine, Ser: 1.12 mg/dL (ref 0.76–1.27)
GFR calc Af Amer: 86 mL/min/{1.73_m2} (ref >59)
GFR calc non Af Amer: 74 mL/min/{1.73_m2} (ref >59)
Globulin, Total: 2.8 g/dL (ref 1.5–4.5)
Glucose: 103 mg/dL — ABNORMAL HIGH (ref 65–99)
Potassium: 4.2 mmol/L (ref 3.5–5.2)
Sodium: 142 mmol/L (ref 134–144)
Total Bilirubin: 0.3 mg/dL (ref 0.0–1.2)
Total Protein: 6.7 g/dL (ref 6.0–8.5)

## 2013-09-28 LAB — LIPID PANEL
Chol/HDL Ratio: 4.2 ratio units (ref 0.0–5.0)
Cholesterol, Total: 130 mg/dL (ref 100–199)
HDL: 31 mg/dL — ABNORMAL LOW (ref >39)
LDL Calculated: 73 mg/dL (ref 0–99)
Triglycerides: 130 mg/dL (ref 0–149)
VLDL Cholesterol Cal: 26 mg/dL (ref 5–40)

## 2013-09-29 NOTE — Progress Notes (Signed)
Quick Note:  Call Patient  Labs abnormal: Liver enzymes are elevated. May be due to illness and medications. Rest of labs okay.  Recommendations: Will recheck at follow up.  ______

## 2013-10-01 ENCOUNTER — Ambulatory Visit (INDEPENDENT_AMBULATORY_CARE_PROVIDER_SITE_OTHER): Payer: BC Managed Care – PPO | Admitting: Family Medicine

## 2013-10-01 ENCOUNTER — Encounter: Payer: Self-pay | Admitting: Family Medicine

## 2013-10-01 VITALS — BP 154/89 | HR 95 | Temp 97.2°F | Ht 72.0 in | Wt 274.0 lb

## 2013-10-01 DIAGNOSIS — J45901 Unspecified asthma with (acute) exacerbation: Secondary | ICD-10-CM | POA: Insufficient documentation

## 2013-10-01 DIAGNOSIS — E669 Obesity, unspecified: Secondary | ICD-10-CM

## 2013-10-01 DIAGNOSIS — N400 Enlarged prostate without lower urinary tract symptoms: Secondary | ICD-10-CM

## 2013-10-01 DIAGNOSIS — J45909 Unspecified asthma, uncomplicated: Secondary | ICD-10-CM

## 2013-10-01 DIAGNOSIS — R635 Abnormal weight gain: Secondary | ICD-10-CM

## 2013-10-01 MED ORDER — BUDESONIDE-FORMOTEROL FUMARATE 160-4.5 MCG/ACT IN AERO: 2.0000 | INHALATION_SPRAY | Freq: Two times a day (BID) | RESPIRATORY_TRACT | Status: DC

## 2013-10-01 NOTE — Patient Instructions (Signed)
      Dr Geoff Dacanay's Recommendations  For nutrition information, I recommend books:  1).Eat to Live by Dr Joel Fuhrman. 2).Prevent and Reverse Heart Disease by Dr Caldwell Esselstyn. 3) Dr Neal Barnard's Book:  Program to Reverse Diabetes  Exercise recommendations are:  If unable to walk, then the patient can exercise in a chair 3 times a day. By flapping arms like a bird gently and raising legs outwards to the front.  If ambulatory, the patient can go for walks for 30 minutes 3 times a week. Then increase the intensity and duration as tolerated.  Goal is to try to attain exercise frequency to 5 times a week.  If applicable: Best to perform resistance exercises (machines or weights) 2 days a week and cardio type exercises 3 days per week.  

## 2013-10-01 NOTE — Progress Notes (Signed)
Patient ID: [name], male   DOB: 03/29/1956, 57 y.o.   MRN: [id number] SUBJECTIVE: CC: Chief Complaint  Patient presents with  . Follow-up    reck sinuses discuss labs     HPI: Here for follow up of his asthmatic bronchitis. Tremendously pleased about his breathing and ability to sleep at night without coughing and SOB. Sinuses a little congested. Otherwise , he is doing great.   Past Medical History  Diagnosis Date  . Asthma   . BPH (benign prostatic hyperplasia)   . Obesity    Past Surgical History  Procedure Laterality Date  . Hernia repair    . Cholecystectomy     History   Social History  . Marital Status: Married    Spouse Name: N/A    Number of Children: N/A  . Years of Education: N/A   Occupational History  . Not on file.   Social History Main Topics  . Smoking status: Former Games developer  . Smokeless tobacco: Not on file  . Alcohol Use: No  . Drug Use: No  . Sexual Activity: Yes   Other Topics Concern  . Not on file   Social History Narrative  . No narrative on file   No family history on file. Current Outpatient Prescriptions on File Prior to Visit  Medication Sig Dispense Refill  . albuterol (PROVENTIL HFA;VENTOLIN HFA) 108 (90 BASE) MCG/ACT inhaler Inhale 2 puffs into the lungs every 6 (six) hours as needed for wheezing.      . benzonatate (TESSALON) 100 MG capsule Take 100 mg by mouth 3 (three) times daily as needed for cough.      . cetirizine (ZYRTEC) 10 MG tablet Take 10 mg by mouth daily.      Marland Kitchen doxycycline (DORYX) 100 MG EC tablet Take 100 mg by mouth 2 (two) times daily.      . montelukast (SINGULAIR) 10 MG tablet Take 1 tablet (10 mg total) by mouth at bedtime.  30 tablet  3  . predniSONE (DELTASONE) 20 MG tablet Take 2 tablets (40 mg total) by mouth daily. For 3 days then 1 tablet daily for 2 days, then 1/2 tablet daily for 2 days  9 tablet  0   No current facility-administered medications on file prior to visit.   No Known  Allergies  There is no immunization history on file for this patient. Prior to Admission medications   Medication Sig Start Date End Date Taking? Authorizing Provider  albuterol (PROVENTIL HFA;VENTOLIN HFA) 108 (90 BASE) MCG/ACT inhaler Inhale 2 puffs into the lungs every 6 (six) hours as needed for wheezing.   Yes Historical Provider, MD  benzonatate (TESSALON) 100 MG capsule Take 100 mg by mouth 3 (three) times daily as needed for cough.   Yes Historical Provider, MD  budesonide-formoterol (SYMBICORT) 80-4.5 MCG/ACT inhaler Inhale 2 puffs into the lungs 2 (two) times daily.   Yes Historical Provider, MD  cetirizine (ZYRTEC) 10 MG tablet Take 10 mg by mouth daily.   Yes Historical Provider, MD  doxycycline (DORYX) 100 MG EC tablet Take 100 mg by mouth 2 (two) times daily.   Yes Historical Provider, MD  montelukast (SINGULAIR) 10 MG tablet Take 1 tablet (10 mg total) by mouth at bedtime. 09/27/13  Yes Ileana Ladd, MD  predniSONE (DELTASONE) 20 MG tablet Take 2 tablets (40 mg total) by mouth daily. For 3 days then 1 tablet daily for 2 days, then 1/2 tablet daily for 2 days 09/27/13  Yes Thelma Barge  Casimiro Needle, MD     ROS: As above in the HPI. All other systems are stable or negative.  OBJECTIVE: APPEARANCE:  Patient in no acute distress.The patient appeared well nourished and normally developed. Acyanotic. Waist: VITAL SIGNS:BP 154/89  Pulse 95  Temp(Src) 97.2 F (36.2 C) (Oral)  Ht 6' (1.829 m)  Wt 274 lb (124.286 kg)  BMI 37.15 kg/m2   SKIN: warm and  Dry without overt rashes, tattoos and scars  HEAD and Neck: without JVD, Head and scalp: normal Eyes:No scleral icterus. Fundi normal, eye movements normal. Ears: Auricle normal, canal normal, Tympanic membranes normal, insufflation normal. Nose: normal Throat: normal Neck & thyroid: normal  CHEST & LUNGS: Chest wall: normal Lungs: Clear  CVS: Reveals the PMI to be normally located. Regular rhythm, First and Second Heart  sounds are normal,  absence of murmurs, rubs or gallops. Peripheral vasculature: Radial pulses: normal Dorsal pedis pulses: normal Posterior pulses: normal  ABDOMEN:  Appearance: normal Benign, no organomegaly, no masses, no Abdominal Aortic enlargement. No Guarding , no rebound. No Bruits. Bowel sounds: normal  RECTAL: N/A GU: N/A  EXTREMETIES: nonedematous.  MUSCULOSKELETAL:  Spine: normal Joints: intact  NEUROLOGIC: oriented to time,place and person; nonfocal. Strength is normal Sensory is normal Reflexes are normal Cranial Nerves are normal.  ASSESSMENT: Asthma with acute exacerbation - improved - Plan: budesonide-formoterol (SYMBICORT) 160-4.5 MCG/ACT inhaler  Asthma  BPH (benign prostatic hyperplasia)  Obesity  PLAN:  No orders of the defined types were placed in this encounter.   Meds ordered this encounter  Medications  . budesonide-formoterol (SYMBICORT) 160-4.5 MCG/ACT inhaler    Sig: Inhale 2 puffs into the lungs 2 (two) times daily.    Dispense:  1 Inhaler    Refill:  3   Medications Discontinued During This Encounter  Medication Reason  . budesonide-formoterol (SYMBICORT) 80-4.5 MCG/ACT inhaler Dose change  when he completes his prednisone he should start on the symbicort. A sample and a coupon card was given in addition to the Rx refill. Use the albuterol as a rescue drug. Continue on the singulair. Diet and exercise discussed.       Dr Woodroe Mode Recommendations  For nutrition information, I recommend books:  1).Eat to Live by Dr Monico Hoar. 2).Prevent and Reverse Heart Disease by Dr Suzzette Righter. 3) Dr Katherina Right Book:  Program to Reverse Diabetes  Exercise recommendations are:  If unable to walk, then the patient can exercise in a chair 3 times a day. By flapping arms like a bird gently and raising legs outwards to the front.  If ambulatory, the patient can go for walks for 30 minutes 3 times a week. Then increase  the intensity and duration as tolerated.  Goal is to try to attain exercise frequency to 5 times a week.  If applicable: Best to perform resistance exercises (machines or weights) 2 days a week and cardio type exercises 3 days per week.  Return in about 4 weeks (around 10/29/2013).  Vonnetta Akey P. [place]esto Charon, M.D.

## 2013-11-09 ENCOUNTER — Other Ambulatory Visit: Payer: Self-pay

## 2013-11-09 MED ORDER — ALBUTEROL SULFATE HFA 108 (90 BASE) MCG/ACT IN AERS: 2.0000 | INHALATION_SPRAY | Freq: Four times a day (QID) | RESPIRATORY_TRACT | Status: DC | PRN

## 2013-11-15 ENCOUNTER — Ambulatory Visit (INDEPENDENT_AMBULATORY_CARE_PROVIDER_SITE_OTHER): Payer: BC Managed Care – PPO | Admitting: Family Medicine

## 2013-11-15 ENCOUNTER — Encounter (INDEPENDENT_AMBULATORY_CARE_PROVIDER_SITE_OTHER): Payer: Self-pay

## 2013-11-15 ENCOUNTER — Encounter: Payer: Self-pay | Admitting: Family Medicine

## 2013-11-15 VITALS — BP 135/84 | HR 80 | Temp 98.0°F | Ht 72.0 in | Wt 277.0 lb

## 2013-11-15 DIAGNOSIS — J45909 Unspecified asthma, uncomplicated: Secondary | ICD-10-CM

## 2013-11-15 DIAGNOSIS — R899 Unspecified abnormal finding in specimens from other organs, systems and tissues: Secondary | ICD-10-CM | POA: Insufficient documentation

## 2013-11-15 DIAGNOSIS — R6889 Other general symptoms and signs: Secondary | ICD-10-CM

## 2013-11-15 DIAGNOSIS — E669 Obesity, unspecified: Secondary | ICD-10-CM

## 2013-11-15 NOTE — Progress Notes (Signed)
Patient ID: [name], male   DOB: [birth year], 57 y.o.   MRN: [id number] SUBJECTIVE: CC: Chief Complaint  Patient presents with  . Follow-up    1 month reck bronchitis/asthma to reck labs states he finiished his prednison and his LFT was up some     HPI: Here for follow up of Asthma. No chest pain , no SOB, not wheezing anymore. He is back to baseline. He is on Singulair and symbicort inhalers. Weight gain from Thanksgiving.  Past Medical History  Diagnosis Date  . Asthma   . BPH (benign prostatic hyperplasia)   . Obesity    Past Surgical History  Procedure Laterality Date  . Hernia repair    . Cholecystectomy     History   Social History  . Marital Status: Married    Spouse Name: N/A    Number of Children: N/A  . Years of Education: N/A   Occupational History  . Not on file.   Social History Main Topics  . Smoking status: Former Games developer  . Smokeless tobacco: Not on file  . Alcohol Use: No  . Drug Use: No  . Sexual Activity: Yes   Other Topics Concern  . Not on file   Social History Narrative  . No narrative on file   No family history on file. Current Outpatient Prescriptions on File Prior to Visit  Medication Sig Dispense Refill  . albuterol (PROVENTIL HFA;VENTOLIN HFA) 108 (90 BASE) MCG/ACT inhaler Inhale 2 puffs into the lungs every 6 (six) hours as needed for wheezing.  18 g  4  . budesonide-formoterol (SYMBICORT) 160-4.5 MCG/ACT inhaler Inhale 2 puffs into the lungs 2 (two) times daily.  1 Inhaler  3  . cetirizine (ZYRTEC) 10 MG tablet Take 10 mg by mouth daily.      . montelukast (SINGULAIR) 10 MG tablet Take 1 tablet (10 mg total) by mouth at bedtime.  30 tablet  3   No current facility-administered medications on file prior to visit.   No Known Allergies  There is no immunization history on file for this patient. Prior to Admission medications   Medication Sig Start Date End Date Taking? Authorizing Provider  albuterol (PROVENTIL  HFA;VENTOLIN HFA) 108 (90 BASE) MCG/ACT inhaler Inhale 2 puffs into the lungs every 6 (six) hours as needed for wheezing. 11/09/13  Yes Ileana Ladd, MD  budesonide-formoterol [facility]) 160-4.5 MCG/ACT inhaler Inhale 2 puffs into the lungs 2 (two) times daily. 10/01/13  Yes Ileana Ladd, MD  cetirizine (ZYRTEC) 10 MG tablet Take 10 mg by mouth daily.   Yes Historical Provider, MD  montelukast (SINGULAIR) 10 MG tablet Take 1 tablet (10 mg total) by mouth at bedtime. 09/27/13  Yes Ileana Ladd, MD     ROS: As above in the HPI. All other systems are stable or negative.  OBJECTIVE: APPEARANCE:  Patient in no acute distress.The patient appeared well nourished and normally developed. Acyanotic. Waist: VITAL SIGNS:BP 135/84  Pulse 80  Temp(Src) 98 F (36.7 C) (Oral)  Ht 6' (1.829 m)  Wt 277 lb (125.646 kg)  BMI 37.56 kg/m2 Obese WM  SKIN: warm and  Dry without overt rashes, tattoos and scars  HEAD and Neck: without JVD, Head and scalp: normal Eyes:No scleral icterus. Fundi normal, eye movements normal. Ears: Auricle normal, canal normal, Tympanic membranes normal, insufflation normal. Nose: normal Throat: normal Neck & thyroid: normal  CHEST & LUNGS: Chest wall: normal Lungs: Clear  CVS: Reveals the PMI to be normally  located. Regular rhythm, First and Second Heart sounds are normal,  absence of murmurs, rubs or gallops. Peripheral vasculature: Radial pulses: normal Dorsal pedis pulses: normal Posterior pulses: normal  ABDOMEN:  Appearance: Obese Benign, no organomegaly, no masses, no Abdominal Aortic enlargement. No Guarding , no rebound. No Bruits. Bowel sounds: normal  RECTAL: N/A GU: N/A  EXTREMETIES: nonedematous.  MUSCULOSKELETAL:  Spine: normal Joints: intact  NEUROLOGIC: oriented to time,place and person; nonfocal. Strength is normal Sensory is normal Reflexes are normal Cranial Nerves are normal.  Results for orders placed in visit on  09/27/13  CMP14+EGFR      Result Value Range   Glucose 103 (*) 65 - 99 mg/dL   BUN 5 (*) 6 - 24 mg/dL   Creatinine, Ser 4.54  0.76 - 1.27 mg/dL   GFR calc non Af Amer 74  >59 mL/min/1.73   GFR calc Af Amer 86  >59 mL/min/1.73   BUN/Creatinine Ratio 4 (*) 9 - 20   Sodium 142  134 - 144 mmol/L   Potassium 4.2  3.5 - 5.2 mmol/L   Chloride 101  97 - 108 mmol/L   CO2 24  18 - 29 mmol/L   Calcium 8.9  8.7 - 10.2 mg/dL   Total Protein 6.7  6.0 - 8.5 g/dL   Albumin 3.9  3.5 - 5.5 g/dL   Globulin, Total 2.8  1.5 - 4.5 g/dL   Albumin/Globulin Ratio 1.4  1.1 - 2.5   Total Bilirubin 0.3  0.0 - 1.2 mg/dL   Alkaline Phosphatase 86  39 - 117 IU/L   AST 58 (*) 0 - 40 IU/L   ALT 70 (*) 0 - 44 IU/L  LIPID PANEL      Result Value Range   Cholesterol, Total 130  100 - 199 mg/dL   Triglycerides 098  0 - 149 mg/dL   HDL 31 (*) >11 mg/dL   VLDL Cholesterol Cal 26  5 - 40 mg/dL   LDL Calculated 73  0 - 99 mg/dL   Chol/HDL Ratio 4.2  0.0 - 5.0 ratio units    PFTs:  FVC:82% FEV1:50% Moderately severe Obstruction.  ASSESSMENT: Asthma  Obesity  Abnormal laboratory test - Plan: Hepatic function panel  PLAN:  Orders Placed This Encounter  Procedures  . Hepatic function panel   No orders of the defined types were placed in this encounter.   Medications Discontinued During This Encounter  Medication Reason  . doxycycline (DORYX) 100 MG EC tablet Completed Course  . benzonatate (TESSALON) 100 MG capsule Completed Course  . predniSONE (DELTASONE) 20 MG tablet Completed Course        Dr Woodroe Mode Recommendations  For nutrition information, I recommend books:  1).Eat to Live by Dr Monico Hoar. 2).Prevent and Reverse Heart Disease by Dr Suzzette Righter. 3) Dr Katherina Right Book:  Program to Reverse Diabetes  Exercise recommendations are:  If unable to walk, then the patient can exercise in a chair 3 times a day. By flapping arms like a bird gently and raising legs outwards to  the front.  If ambulatory, the patient can go for walks for 30 minutes 3 times a week. Then increase the intensity and duration as tolerated.  Goal is to try to attain exercise frequency to 5 times a week.  If applicable: Best to perform resistance exercises (machines or weights) 2 days a week and cardio type exercises 3 days per week.  Handout and  Discussion in the AVS on Asthma and Obesity  Continue singulair and symbicort.  Return in about 2 months (around 01/16/2014) for Recheck medical problems. asthma and weight  Armen Waring P. [place] Charon, M.D.

## 2013-11-15 NOTE — Patient Instructions (Signed)
Asthma, Adult Asthma is a recurring condition in which the airways tighten and narrow. Asthma can make it difficult to breathe. It can cause coughing, wheezing, and shortness of breath. Asthma episodes (also called asthma attacks) range from minor to life-threatening. Asthma cannot be cured, but medicines and lifestyle changes can help control it. CAUSES Asthma is believed to be caused by inherited (genetic) and environmental factors, but its exact cause is unknown. Asthma may be triggered by allergens, lung infections, or irritants in the air. Asthma triggers are different for each person. Common triggers include:   Animal dander.  Dust mites.  Cockroaches.  Pollen from trees or grass.  Mold.  Smoke.  Air pollutants such as dust, household cleaners, hair sprays, aerosol sprays, paint fumes, strong chemicals, or strong odors.  Cold air, weather changes, and winds (which increase molds and pollens in the air).  Strong emotional expressions such as crying or laughing hard.  Stress.  Certain medicines (such as aspirin) or types of drugs (such as beta-blockers).  Sulfites in foods and drinks. Foods and drinks that may contain sulfites include dried fruit, potato chips, and sparkling grape juice.  Infections or inflammatory conditions such as the flu, a cold, or an inflammation of the nasal membranes (rhinitis).  Gastroesophageal reflux disease (GERD).  Exercise or strenuous activity. SYMPTOMS Symptoms may occur immediately after asthma is triggered or many hours later. Symptoms include:  Wheezing.  Excessive nighttime or early morning coughing.  Frequent or severe coughing with a common cold.  Chest tightness.  Shortness of breath. DIAGNOSIS  The diagnosis of asthma is made by a review of your medical history and a physical exam. Tests may also be performed. These may include:  Lung function studies. These tests show how much air you breath in and out.  Allergy  tests.  Imaging tests such as X-rays. TREATMENT  Asthma cannot be cured, but it can usually be controlled. Treatment involves identifying and avoiding your asthma triggers. It also involves medicines. There are 2 classes of medicine used for asthma treatment:   Controller medicines. These prevent asthma symptoms from occurring. They are usually taken every day.  Reliever or rescue medicines. These quickly relieve asthma symptoms. They are used as needed and provide short-term relief. Your health care provider will help you create an asthma action plan. An asthma action plan is a written plan for managing and treating your asthma attacks. It includes a list of your asthma triggers and how they may be avoided. It also includes information on when medicines should be taken and when their dosage should be changed. An action plan may also involve the use of a device called a peak flow meter. A peak flow meter measures how well the lungs are working. It helps you monitor your condition. HOME CARE INSTRUCTIONS   Take medicine as directed by your health care provider. Speak with your health care provider if you have questions about how or when to take the medicines.  Use a peak flow meter as directed by your health care provider. Record and keep track of readings.  Understand and use the action plan to help minimize or stop an asthma attack without needing to seek medical care.  Control your home environment in the following ways to help prevent asthma attacks:  Do not smoke. Avoid being exposed to secondhand smoke.  Change your heating and air conditioning filter regularly.  Limit your use of fireplaces and wood stoves.  Get rid of pests (such as roaches and   mice) and their droppings.  Throw away plants if you see mold on them.  Clean your floors and dust regularly. Use unscented cleaning products.  Try to have someone else vacuum for you regularly. Stay out of rooms while they are being  vacuumed and for a short while afterward. If you vacuum, use a dust mask from a hardware store, a double-layered or microfilter vacuum cleaner bag, or a vacuum cleaner with a HEPA filter.  Replace carpet with wood, tile, or vinyl flooring. Carpet can trap dander and dust.  Use allergy-proof pillows, mattress covers, and box spring covers.  Wash bed sheets and blankets every week in hot water and dry them in a dryer.  Use blankets that are made of polyester or cotton.  Clean bathrooms and kitchens with bleach. If possible, have someone repaint the walls in these rooms with mold-resistant paint. Keep out of the rooms that are being cleaned and painted.  Wash hands frequently. SEEK MEDICAL CARE IF:   You have wheezing, shortness of breath, or a cough even if taking medicine to prevent attacks.  The colored mucus you cough up (sputum) is thicker than usual.  Your sputum changes from clear or white to yellow, green, gray, or bloody.  You have any problems that may be related to the medicines you are taking (such as a rash, itching, swelling, or trouble breathing).  You are using a reliever medicine more than 2 3 times per week.  Your peak flow is still at 50 79% of you personal best after following your action plan for 1 hour. SEEK IMMEDIATE MEDICAL CARE IF:   You seem to be getting worse and are unresponsive to treatment during an asthma attack.  You are short of breath even at rest.  You get short of breath when doing very little physical activity.  You have difficulty eating, drinking, or talking due to asthma symptoms.  You develop chest pain.  You develop a fast heartbeat.  You have a bluish color to your lips or fingernails.  You are lightheaded, dizzy, or faint.  Your peak flow is less than 50% of your personal best.  You have a fever or persistent symptoms for more than 2 3 days.  You have a fever and symptoms suddenly get worse. MAKE SURE YOU:   Understand these  instructions.  Will watch your condition.  Will get help right away if you are not doing well or get worse. Document Released: 12/02/2005 Document Revised: 08/04/2013 Document Reviewed: 07/01/2013 [facility] Patient Information 2014 [place], [place].        Dr Woodroe Mode Recommendations  For nutrition information, I recommend books:  1).Eat to Live by Dr Monico Hoar. 2).Prevent and Reverse Heart Disease by Dr Suzzette Righter. 3) Dr Katherina Right Book:  Program to Reverse Diabetes  Exercise recommendations are:  If unable to walk, then the patient can exercise in a chair 3 times a day. By flapping arms like a bird gently and raising legs outwards to the front.  If ambulatory, the patient can go for walks for 30 minutes 3 times a week. Then increase the intensity and duration as tolerated.  Goal is to try to attain exercise frequency to 5 times a week.  If applicable: Best to perform resistance exercises (machines or weights) 2 days a week and cardio type exercises 3 days per week.

## 2013-11-16 LAB — HEPATIC FUNCTION PANEL
ALT: 41 IU/L (ref 0–44)
AST: 27 IU/L (ref 0–40)
Albumin: 4.1 g/dL (ref 3.5–5.5)
Alkaline Phosphatase: 95 IU/L (ref 39–117)
Bilirubin, Direct: 0.13 mg/dL (ref 0.00–0.40)
Total Bilirubin: 0.5 mg/dL (ref 0.0–1.2)
Total Protein: 6.7 g/dL (ref 6.0–8.5)

## 2013-11-19 NOTE — Progress Notes (Signed)
Quick Note:  Call patient. Labs normal. No change in plan. ______ 

## 2014-01-27 ENCOUNTER — Ambulatory Visit (INDEPENDENT_AMBULATORY_CARE_PROVIDER_SITE_OTHER): Payer: BC Managed Care – PPO | Admitting: Family Medicine

## 2014-01-27 ENCOUNTER — Encounter: Payer: Self-pay | Admitting: Family Medicine

## 2014-01-27 VITALS — BP 117/77 | HR 82 | Temp 97.4°F | Ht 72.0 in | Wt 272.8 lb

## 2014-01-27 DIAGNOSIS — J45909 Unspecified asthma, uncomplicated: Secondary | ICD-10-CM

## 2014-01-27 DIAGNOSIS — E669 Obesity, unspecified: Secondary | ICD-10-CM

## 2014-01-27 DIAGNOSIS — J4489 Other specified chronic obstructive pulmonary disease: Secondary | ICD-10-CM

## 2014-01-27 DIAGNOSIS — Z23 Encounter for immunization: Secondary | ICD-10-CM

## 2014-01-27 DIAGNOSIS — J449 Chronic obstructive pulmonary disease, unspecified: Secondary | ICD-10-CM

## 2014-01-27 MED ORDER — TIOTROPIUM BROMIDE MONOHYDRATE 18 MCG IN CAPS: 18.0000 ug | ORAL_CAPSULE | Freq: Every day | RESPIRATORY_TRACT | Status: DC

## 2014-01-27 NOTE — Progress Notes (Signed)
Patient ID: [name], male   DOB: Apr 13, 1956, 58 y.o.   MRN: [id number] SUBJECTIVE: CC: Chief Complaint  Patient presents with  . Follow-up    2 MONTH FOLLOW UP STATES BROTHER IN LAW HAS LUNG /BRAIN CANCER     HPI: Here to recheck asthma and obesity Ex-smoker Breathing fine, stopped the albuterol. Still on symbicort, singulair,and zyrtec.  Past Medical History  Diagnosis Date  . Asthma   . BPH (benign prostatic hyperplasia)   . Obesity    Past Surgical History  Procedure Laterality Date  . Hernia repair    . Cholecystectomy     History   Social History  . Marital Status: Married    Spouse Name: N/A    Number of Children: N/A  . Years of Education: N/A   Occupational History  . Not on file.   Social History Main Topics  . Smoking status: Former Games developermoker  . Smokeless tobacco: Not on file  . Alcohol Use: No  . Drug Use: No  . Sexual Activity: Yes   Other Topics Concern  . Not on file   Social History Narrative  . No narrative on file   No family history on file. Current Outpatient Prescriptions on File Prior to Visit  Medication Sig Dispense Refill  . albuterol (PROVENTIL HFA;VENTOLIN HFA) 108 (90 BASE) MCG/ACT inhaler Inhale 2 puffs into the lungs every 6 (six) hours as needed for wheezing.  18 g  4  . budesonide-formoterol (SYMBICORT) 160-4.5 MCG/ACT inhaler Inhale 2 puffs into the lungs 2 (two) times daily.  1 Inhaler  3  . cetirizine (ZYRTEC) 10 MG tablet Take 10 mg by mouth daily.      . montelukast (SINGULAIR) 10 MG tablet Take 1 tablet (10 mg total) by mouth at bedtime.  30 tablet  3   No current facility-administered medications on file prior to visit.   No Known Allergies Immunization History  Administered Date(s) Administered  . Tdap 01/27/2014   Prior to Admission medications   Medication Sig Start Date End Date Taking? Authorizing Provider  albuterol (PROVENTIL HFA;VENTOLIN HFA) 108 (90 BASE) MCG/ACT inhaler Inhale 2 puffs into the lungs  every 6 (six) hours as needed for wheezing. 11/09/13   Ileana LaddFrancis P Jariyah Hackley, MD  budesonide-formoterol [facility](SYMBICORT) 160-4.5 MCG/ACT inhaler Inhale 2 puffs into the lungs 2 (two) times daily. 10/01/13   Ileana LaddFrancis P Quentin Shorey, MD  cetirizine (ZYRTEC) 10 MG tablet Take 10 mg by mouth daily.    Historical Provider, MD  montelukast (SINGULAIR) 10 MG tablet Take 1 tablet (10 mg total) by mouth at bedtime. 09/27/13   Ileana LaddFrancis P Garnell Phenix, MD     ROS: As above in the HPI. All other systems are stable or negative.  OBJECTIVE: APPEARANCE:  Patient in no acute distress.The patient appeared well nourished and normally developed. Acyanotic. Waist: VITAL SIGNS:BP 117/77  Pulse 82  Temp(Src) 97.4 F (36.3 C) (Oral)  Ht 6' (1.829 m)  Wt 272 lb 12.8 oz (123.741 kg)  BMI 36.99 kg/m2  SpO2 97%  Obese WM FEV1:56% FVC96%  SKIN: warm and  Dry without overt rashes, tattoos and scars  HEAD and Neck: without JVD, Head and scalp: normal Eyes:No scleral icterus. Fundi normal, eye movements normal. Ears: Auricle normal, canal normal, Tympanic membranes normal, insufflation normal. Nose: normal Throat: normal Neck & thyroid: normal  CHEST & LUNGS: Chest wall: normal Lungs: prolonged expiration phase.   CVS: Reveals the PMI to be normally located. Regular rhythm, First and Second Heart sounds  are normal,  absence of murmurs, rubs or gallops. Peripheral vasculature: Radial pulses: normal Dorsal pedis pulses: normal Posterior pulses: normal  ABDOMEN:  Appearance: normal Benign, no organomegaly, no masses, no Abdominal Aortic enlargement. No Guarding , no rebound. No Bruits. Bowel sounds: normal  RECTAL: N/A GU: N/A  EXTREMETIES: nonedematous.  MUSCULOSKELETAL:  Spine: normal Joints: intact  NEUROLOGIC: oriented to time,place and person; nonfocal. Strength is normal Sensory is normal Reflexes are normal Cranial Nerves are normal.   FEV1 56% FVC 96%  ASSESSMENT:  Asthma - Plan: PR BREATHING  CAPACITY TEST, tiotropium (SPIRIVA) 18 MCG inhalation capsule  COPD (chronic obstructive pulmonary disease) - Plan: tiotropium (SPIRIVA) 18 MCG inhalation capsule  Obesity  Need for Tdap vaccination - Plan: Tdap vaccine greater than or equal to 7yo IM  PLAN: Gave him 2 samples of spiriva. Demonstrated use of the spiriva. Continue with the other medications.  Orders Placed This Encounter  Procedures  . Tdap vaccine greater than or equal to 7yo IM  . PR BREATHING CAPACITY TEST   Meds ordered this encounter  Medications  . tiotropium (SPIRIVA) 18 MCG inhalation capsule    Sig: Place 1 capsule (18 mcg total) into inhaler and inhale daily.    Dispense:  30 capsule    Refill:  12   There are no discontinued medications. Return in about 2 months (around 03/27/2014) for Recheck medical problems.  Meg Niemeier P. [place] Charon, M.D.

## 2014-01-27 NOTE — Patient Instructions (Signed)

## 2014-02-28 ENCOUNTER — Ambulatory Visit (INDEPENDENT_AMBULATORY_CARE_PROVIDER_SITE_OTHER): Payer: BC Managed Care – PPO | Admitting: Family Medicine

## 2014-02-28 ENCOUNTER — Encounter: Payer: Self-pay | Admitting: Family Medicine

## 2014-02-28 VITALS — BP 133/86 | HR 97 | Temp 98.5°F | Ht 72.0 in | Wt 279.8 lb

## 2014-02-28 DIAGNOSIS — H109 Unspecified conjunctivitis: Secondary | ICD-10-CM

## 2014-02-28 MED ORDER — POLYMYXIN B-TRIMETHOPRIM 10000-0.1 UNIT/ML-% OP SOLN: 2.0000 [drp] | OPHTHALMIC | Status: DC

## 2014-02-28 NOTE — Progress Notes (Signed)
   Subjective:    Patient ID: [name], male    DOB: 1956-05-19, 58 y.o.   MRN: [id number]  HPI  C/o pink eye bilateral for 3 days  Review of Systems C/o red eye   No chest pain, SOB, HA, dizziness, vision change, N/V, diarrhea, constipation, dysuria, urinary urgency or frequency, myalgias, arthralgias or rash.  Objective:   Physical Exam  Vital signs noted  Well developed well nourished male.  HEENT - Head atraumatic Normocephalic                Eyes - PERRLA, Conjuctiva - injected bilateral Sclera- Clear EOMI Respiratory - Lungs CTA bilateral Cardiac - RRR S1 and S2 without murmur       Assessment & Plan:  Conjunctivitis - Plan: trimethoprim-polymyxin b (POLYTRIM) ophthalmic solution  Deatra CanterWilliam J Oxford FNP

## 2014-03-29 ENCOUNTER — Encounter: Payer: Self-pay | Admitting: Family Medicine

## 2014-03-29 ENCOUNTER — Ambulatory Visit (INDEPENDENT_AMBULATORY_CARE_PROVIDER_SITE_OTHER): Payer: BC Managed Care – PPO

## 2014-03-29 ENCOUNTER — Ambulatory Visit (INDEPENDENT_AMBULATORY_CARE_PROVIDER_SITE_OTHER): Payer: BC Managed Care – PPO | Admitting: Family Medicine

## 2014-03-29 VITALS — BP 139/74 | HR 85 | Temp 98.2°F | Ht 72.0 in | Wt 282.6 lb

## 2014-03-29 DIAGNOSIS — J449 Chronic obstructive pulmonary disease, unspecified: Secondary | ICD-10-CM

## 2014-03-29 DIAGNOSIS — E669 Obesity, unspecified: Secondary | ICD-10-CM

## 2014-03-29 DIAGNOSIS — J45909 Unspecified asthma, uncomplicated: Secondary | ICD-10-CM

## 2014-03-29 NOTE — Patient Instructions (Signed)
Continue present regimen of medications. Lose weight       Dr Woodroe ModeFrancis Shanece Cochrane's Recommendations  For nutrition information, I recommend books:  1).Eat to Live by Dr Monico HoarJoel Fuhrman. 2).Prevent and Reverse Heart Disease by Dr Suzzette Righteraldwell Esselstyn. 3) Dr Katherina RightNeal Barnard's Book:  Program to Reverse Diabetes  Exercise recommendations are:  If unable to walk, then the patient can exercise in a chair 3 times a day. By flapping arms like a bird gently and raising legs outwards to the front.  If ambulatory, the patient can go for walks for 30 minutes 3 times a week. Then increase the intensity and duration as tolerated.  Goal is to try to attain exercise frequency to 5 times a week.  If applicable: Best to perform resistance exercises (machines or weights) 2 days a week and cardio type exercises 3 days per week.

## 2014-03-29 NOTE — Progress Notes (Addendum)
Patient ID: [name], male   DOB: 1956-08-23, 58 y.o.   MRN: [id number] SUBJECTIVE: CC: Chief Complaint  Patient presents with  . Follow-up    2 month follow up    HPI: Follow up of COPD: breathing fine now especially considering the pollen and his allergies. He feels fine. No wheezes no coughs. No fever. Using his ,medications  Past Medical History  Diagnosis Date  . Asthma   . BPH (benign prostatic hyperplasia)   . Obesity    Past Surgical History  Procedure Laterality Date  . Hernia repair    . Cholecystectomy     History   Social History  . Marital Status: Married    Spouse Name: N/A    Number of Children: N/A  . Years of Education: N/A   Occupational History  . Not on file.   Social History Main Topics  . Smoking status: Former Games developermoker  . Smokeless tobacco: Not on file  . Alcohol Use: No  . Drug Use: No  . Sexual Activity: Yes   Other Topics Concern  . Not on file   Social History Narrative  . No narrative on file   No family history on file. Current Outpatient Prescriptions on File Prior to Visit  Medication Sig Dispense Refill  . albuterol (PROVENTIL HFA;VENTOLIN HFA) 108 (90 BASE) MCG/ACT inhaler Inhale 2 puffs into the lungs every 6 (six) hours as needed for wheezing.  18 g  4  . budesonide-formoterol (SYMBICORT) 160-4.5 MCG/ACT inhaler Inhale 2 puffs into the lungs 2 (two) times daily.  1 Inhaler  3  . cetirizine (ZYRTEC) 10 MG tablet Take 10 mg by mouth daily.      . montelukast (SINGULAIR) 10 MG tablet Take 1 tablet (10 mg total) by mouth at bedtime.  30 tablet  3  . tiotropium (SPIRIVA) 18 MCG inhalation capsule Place 1 capsule (18 mcg total) into inhaler and inhale daily.  30 capsule  12  . trimethoprim-polymyxin b (POLYTRIM) ophthalmic solution Place 2 drops into both eyes every 4 (four) hours.  10 mL  0   No current facility-administered medications on file prior to visit.   No Known Allergies Immunization History  Administered  Date(s) Administered  . Tdap 01/27/2014   Prior to Admission medications   Medication Sig Start Date End Date Taking? Authorizing Provider  albuterol (PROVENTIL HFA;VENTOLIN HFA) 108 (90 BASE) MCG/ACT inhaler Inhale 2 puffs into the lungs every 6 (six) hours as needed for wheezing. 11/09/13   Ileana LaddFrancis P Karlo Goeden, MD  budesonide-formoterol [facility](SYMBICORT) 160-4.5 MCG/ACT inhaler Inhale 2 puffs into the lungs 2 (two) times daily. 10/01/13   Ileana LaddFrancis P Lovie Zarling, MD  cetirizine (ZYRTEC) 10 MG tablet Take 10 mg by mouth daily.    Historical Provider, MD  montelukast (SINGULAIR) 10 MG tablet Take 1 tablet (10 mg total) by mouth at bedtime. 09/27/13   Ileana LaddFrancis P Madie Cahn, MD  tiotropium (SPIRIVA) 18 MCG inhalation capsule Place 1 capsule (18 mcg total) into inhaler and inhale daily. 01/27/14   Ileana LaddFrancis P Kaleah Hagemeister, MD  trimethoprim-polymyxin b (POLYTRIM) ophthalmic solution Place 2 drops into both eyes every 4 (four) hours. 02/28/14   Deatra CanterWilliam J Oxford, FNP     ROS: As above in the HPI. All other systems are stable or negative.  OBJECTIVE: APPEARANCE:  Patient in no acute distress.The patient appeared well nourished and normally developed. Acyanotic. Waist: VITAL SIGNS:BP 139/74  Pulse 85  Temp(Src) 98.2 F (36.8 C) (Oral)  Ht 6' (1.829 m)  Hartford FinancialWt  282 lb 9.6 oz (128.187 kg)  BMI 38.32 kg/m2 WM Obese  SKIN: warm and  Dry without overt rashes, tattoos and scars  HEAD and Neck: without JVD, Head and scalp: normal Eyes:No scleral icterus. Fundi normal, eye movements normal. Ears: Auricle normal, canal normal, Tympanic membranes normal, insufflation normal. Nose: normal Throat: normal Neck & thyroid: normal  CHEST & LUNGS: Chest wall: normal Lungs: Clear  CVS: Reveals the PMI to be normally located. Regular rhythm, First and Second Heart sounds are normal,  absence of murmurs, rubs or gallops. Peripheral vasculature: Radial pulses: normal Dorsal pedis pulses: normal Posterior pulses: normal  ABDOMEN:   Appearance: Obese Benign, no organomegaly, no masses, no Abdominal Aortic enlargement. No Guarding , no rebound. No Bruits. Bowel sounds: normal  RECTAL: N/A GU: N/A  EXTREMETIES: nonedematous.  MUSCULOSKELETAL:  Spine: normal Joints: intact  NEUROLOGIC: oriented to time,place and person; nonfocal. Strength is normal Sensory is normal Reflexes are normal Cranial Nerves are normal.  ASSESSMENT: Obesity  Asthma - Plan: PR BREATHING CAPACITY TEST  COPD (chronic obstructive pulmonary disease) - Plan: PR BREATHING CAPACITY TEST, DG Chest 2 View  PLAN: Lose weight  Continue medication regimen until re-evaluated in 3 months.      Dr Woodroe ModeFrancis Luvern Mcisaac's Recommendations  For nutrition information, I recommend books:  1).Eat to Live by Dr Monico HoarJoel Fuhrman. 2).Prevent and Reverse Heart Disease by Dr Suzzette Righteraldwell Esselstyn. 3) Dr Katherina RightNeal Barnard's Book:  Program to Reverse Diabetes  Exercise recommendations are:  If unable to walk, then the patient can exercise in a chair 3 times a day. By flapping arms like a bird gently and raising legs outwards to the front.  If ambulatory, the patient can go for walks for 30 minutes 3 times a week. Then increase the intensity and duration as tolerated.  Goal is to try to attain exercise frequency to 5 times a week.  If applicable: Best to perform resistance exercises (machines or weights) 2 days a week and cardio type exercises 3 days per week.  Orders Placed This Encounter  Procedures  . DG Chest 2 View    Standing Status: Future     Number of Occurrences: 1     Standing Expiration Date: 05/30/2015    Order Specific Question:  Reason for Exam (SYMPTOM  OR DIAGNOSIS REQUIRED)    Answer:  COPD, wheezing    Order Specific Question:  Preferred imaging location?    Answer:  Internal  . PR BREATHING CAPACITY TEST  FVC=96% FEV1= 58%  [facility] reading (PRIMARY) by  Dr. Modesto CharonWOng: no acute findings.                                   No orders of the defined  types were placed in this encounter.   There are no discontinued medications. Return in about 3 months (around 06/28/2014) for Recheck medical problems with Owens & MinorBill Oxford.  Yavonne Kiss P. Modesto CharonWong, M.D.

## 2014-03-31 ENCOUNTER — Other Ambulatory Visit: Payer: Self-pay | Admitting: Family Medicine

## 2014-03-31 DIAGNOSIS — R9389 Abnormal findings on diagnostic imaging of other specified body structures: Secondary | ICD-10-CM

## 2014-03-31 NOTE — Progress Notes (Signed)
Quick Note:  Call Patient Labs that are abnormal: Small pleural effusion.fluid outside the lung. Recommendations: I would like him to see a lung specialist to check this out   ______

## 2014-04-05 ENCOUNTER — Telehealth: Payer: Self-pay | Admitting: Family Medicine

## 2014-04-05 NOTE — Telephone Encounter (Signed)
Aurea GraffJoan picked up on this call and took care of it.

## 2014-04-21 ENCOUNTER — Ambulatory Visit (INDEPENDENT_AMBULATORY_CARE_PROVIDER_SITE_OTHER)
Admission: RE | Admit: 2014-04-21 | Discharge: 2014-04-21 | Disposition: A | Discharge status: Home / Self Care | Payer: BC Managed Care – PPO | Source: Ambulatory Visit | Attending: Pulmonary Disease | Admitting: Pulmonary Disease

## 2014-04-21 ENCOUNTER — Ambulatory Visit (INDEPENDENT_AMBULATORY_CARE_PROVIDER_SITE_OTHER): Payer: BC Managed Care – PPO | Admitting: Pulmonary Disease

## 2014-04-21 ENCOUNTER — Encounter: Payer: Self-pay | Admitting: Pulmonary Disease

## 2014-04-21 VITALS — BP 130/80 | HR 104 | Temp 98.2°F | Ht 72.0 in | Wt 283.6 lb

## 2014-04-21 DIAGNOSIS — J90 Pleural effusion, not elsewhere classified: Secondary | ICD-10-CM

## 2014-04-21 DIAGNOSIS — J449 Chronic obstructive pulmonary disease, unspecified: Secondary | ICD-10-CM

## 2014-04-21 NOTE — Assessment & Plan Note (Signed)
He was noted to have small Lt effusion on CXR in April in setting or viral respiratory infection.  Clinically better, and physical exam unremarkable.  Will repeat CXR today, and then determine if any additional assessment needed for this.

## 2014-04-21 NOTE — Progress Notes (Signed)
Chief Complaint  Patient presents with  . Pulmonary Consult    for abnormal CXR taken by Dr. Modesto CharonWong due to URI. Pt states he was advised that the CXR is not clearing even after treatment.     History of Present Illness: [name] is a 58 y.o. male former smoker for evaluation of abnormal xray.  He has history of asthma, allergies and COPD.    He had a respiratory infection in April.  He had a chest xray and this showed a small Lt effusion.  He has since recovered and feels better.  He has not had cough or sputum for several days.  He denies fever, chest pain, or hemoptysis.  He is not having wheeze as much as last month.  He says he is allergic to everything, and Spring is a bad season for him.  He has been using symbicort for a while, and this helps.  He was recently started on singulair.  He is taking claritin bid, and his sinus congestion has improved.  He tried spiriva, but this didn't seem to help.  He is not needing albuterol much.  He had pneumonia years ago.  He started smoking at age 58, smoked 1/2 to 1 ppd, and quit in 2005.  He lived in [place] as a child, but [place] the past [place] years.  He works on Art therapistcomputer mainframes.  He denies animal exposures.   Tests: Spirometry 11/15/13 >> FEV1 2.06 (50%), FEV1% 49  Shawnmichael R Micciche  has a past medical history of Asthma; BPH (benign prostatic hyperplasia); and Obesity.  Bryan Dapperddie R Dohrmann  has past surgical history that includes Hernia repair; Cholecystectomy; Nasal sinus surgery; and Trigger finger release.  Prior to Admission medications   Medication Sig Start Date End Date Taking? Authorizing Provider  albuterol (PROVENTIL HFA;VENTOLIN HFA) 108 (90 BASE) MCG/ACT inhaler Inhale 2 puffs into the lungs every 6 (six) hours as needed for wheezing. 11/09/13  Yes Ileana LaddFrancis P Wong, MD  budesonide-formoterol [facility](SYMBICORT) 160-4.5 MCG/ACT inhaler Inhale 2 puffs into the lungs 2 (two) times daily. 10/01/13  Yes Ileana LaddFrancis P  Wong, MD  loratadine (CLARITIN) 10 MG tablet Take 10 mg by mouth 2 (two) times daily.   Yes Historical Provider, MD  montelukast (SINGULAIR) 10 MG tablet Take 1 tablet (10 mg total) by mouth at bedtime. 09/27/13  Yes Ileana LaddFrancis P Wong, MD  tiotropium (SPIRIVA) 18 MCG inhalation capsule Place 1 capsule (18 mcg total) into inhaler and inhale daily. 01/27/14  Yes Ileana LaddFrancis P Wong, MD  trimethoprim-polymyxin b (POLYTRIM) ophthalmic solution Place 2 drops into both eyes every 4 (four) hours. 02/28/14  Yes Deatra CanterWilliam J Oxford, FNP    No Known Allergies  His family history includes Asthma in his son; Heart disease in his father; Liver cancer in his brother.  He  reports that he quit smoking about 10 years ago. His smoking use included Cigarettes. He has a 15 pack-year smoking history. He does not have any smokeless tobacco history on file. He reports that he does not drink alcohol or use illicit drugs.  Review of Systems  Constitutional: Negative for fever and unexpected weight change.  HENT: Positive for congestion and sneezing. Negative for dental problem, ear pain, nosebleeds, postnasal drip, rhinorrhea, sinus pressure, sore throat and trouble swallowing.   Eyes: Negative for redness and itching.  Respiratory: Positive for cough. Negative for chest tightness, shortness of breath and wheezing.   Cardiovascular: Negative for palpitations and leg swelling.  Gastrointestinal:  Negative for nausea and vomiting.  Genitourinary: Negative for dysuria.  Musculoskeletal: Negative for joint swelling.  Skin: Negative for rash.  Neurological: Negative for headaches.  Hematological: Does not bruise/bleed easily.  Psychiatric/Behavioral: Negative for dysphoric mood. The patient is not nervous/anxious.    Physical Exam:  General - No distress ENT - No sinus tenderness, no oral exudate, no LAN, no thyromegaly, TM clear, pupils equal/reactive, MP 4 Cardiac - s1s2 regular, no murmur, pulses symmetric Chest - No  wheeze/rales/dullness, good air entry, normal respiratory excursion Back - No focal tenderness Abd - Soft, non-tender, no organomegaly, + bowel sounds Ext - No edema Neuro - Normal strength, cranial nerves intact Skin - No rashes Psych - Normal mood, and behavior   Lab Results  Component Value Date   WBC 14.1* 10/06/2007   HGB 13.9 10/06/2007   HCT 40.7 10/06/2007   MCV 92.5 10/06/2007   PLT 190 10/06/2007    Lab Results  Component Value Date   CREATININE 1.12 09/27/2013   BUN 5* 09/27/2013   NA 142 09/27/2013   K 4.2 09/27/2013   CL 101 09/27/2013   CO2 24 09/27/2013    Lab Results  Component Value Date   ALT 41 11/15/2013   AST 27 11/15/2013   ALKPHOS 95 11/15/2013   BILITOT 0.5 11/15/2013    Dg Chest 2 View  03/29/2014   CLINICAL DATA:  COPD  EXAM: CHEST  2 VIEW  COMPARISON:  09/25/2013  FINDINGS: Normal heart size, mediastinal contours, and pulmonary vascularity.  Minimal peribronchial thickening.  Left basilar scarring and tiny left pleural effusion.  No infiltrate or pneumothorax.  Bones unremarkable.  IMPRESSION: Minimal bronchitic changes with left basilar scarring and minimal left pleural effusion.   Electronically Signed   By: Ulyses SouthwardMark  Boles M.D.   On: 03/29/2014 16:21    Assessment/Plan:  Coralyn HellingVineet Johnae Friley, MD [facility] Pager:  [phone]

## 2014-04-21 NOTE — Assessment & Plan Note (Signed)
He has extensive history of allergies and asthma.  He has history of smoking.  Previous spirometry showed airflow obstruction.  Will have him continue symbicort, singulair, claritin, and prn albuterol.  He did not feel spiriva helped >> will d/c this.  Will arrange for PFT's to further assess.

## 2014-04-21 NOTE — Progress Notes (Deleted)
   Subjective:    Patient ID: [name], male    DOB: 06-21-1956, 58 y.o.   MRN: [id number]  HPI    Review of Systems  Constitutional: Negative for fever and unexpected weight change.  HENT: Positive for congestion and sneezing. Negative for dental problem, ear pain, nosebleeds, postnasal drip, rhinorrhea, sinus pressure, sore throat and trouble swallowing.   Eyes: Negative for redness and itching.  Respiratory: Positive for cough. Negative for chest tightness, shortness of breath and wheezing.   Cardiovascular: Negative for palpitations and leg swelling.  Gastrointestinal: Negative for nausea and vomiting.  Genitourinary: Negative for dysuria.  Musculoskeletal: Negative for joint swelling.  Skin: Negative for rash.  Neurological: Negative for headaches.  Hematological: Does not bruise/bleed easily.  Psychiatric/Behavioral: Negative for dysphoric mood. The patient is not nervous/anxious.        Objective:   Physical Exam        Assessment & Plan:

## 2014-04-21 NOTE — Patient Instructions (Signed)
Symbicort two puffs twice per day >> rinse mouth after each use Singulair 10 mg nightly Albuterol as needed for cough, wheeze, or chest congestion Stop spiriva Chest xray today Will schedule breathing test (PFT) Follow up in 8 weeks

## 2014-04-25 ENCOUNTER — Telehealth: Payer: Self-pay | Admitting: Pulmonary Disease

## 2014-04-25 NOTE — Telephone Encounter (Signed)
Pt is aware of results. 

## 2014-04-25 NOTE — Telephone Encounter (Signed)
Dg Chest 2 View  04/21/2014   CLINICAL DATA:  COPD, followup pleural effusion  EXAM: CHEST  2 VIEW  COMPARISON:  03/29/2014  FINDINGS: Cardiomediastinal silhouette is stable. No acute infiltrate or pulmonary edema. Left basilar atelectasis or scarring. No pleural effusion. Mild degenerative changes thoracic spine.  IMPRESSION: No pleural effusion.  Left basilar atelectasis or scarring.   Electronically Signed   By: Natasha MeadLiviu  Pop M.D.   On: 04/21/2014 16:08   Will have my nurse inform pt that CXR looks better.  Fluid around Lt lung no longer present.  No change to current treatment plan.

## 2014-06-13 ENCOUNTER — Ambulatory Visit: Payer: BC Managed Care – PPO | Admitting: Pulmonary Disease

## 2015-04-13 ENCOUNTER — Ambulatory Visit (INDEPENDENT_AMBULATORY_CARE_PROVIDER_SITE_OTHER): Payer: BLUE CROSS/BLUE SHIELD | Admitting: Nurse Practitioner

## 2015-04-13 ENCOUNTER — Encounter: Payer: Self-pay | Admitting: Nurse Practitioner

## 2015-04-13 VITALS — BP 143/90 | HR 105 | Temp 97.5°F | Ht 72.0 in | Wt 271.6 lb

## 2015-04-13 DIAGNOSIS — J4521 Mild intermittent asthma with (acute) exacerbation: Secondary | ICD-10-CM | POA: Diagnosis not present

## 2015-04-13 DIAGNOSIS — T7840XA Allergy, unspecified, initial encounter: Secondary | ICD-10-CM | POA: Diagnosis not present

## 2015-04-13 MED ORDER — HYDROCODONE-HOMATROPINE 5-1.5 MG/5ML PO SYRP: 5.0000 mL | ORAL_SOLUTION | Freq: Four times a day (QID) | ORAL | Status: DC | PRN

## 2015-04-13 MED ORDER — ALBUTEROL SULFATE HFA 108 (90 BASE) MCG/ACT IN AERS: 2.0000 | INHALATION_SPRAY | Freq: Four times a day (QID) | RESPIRATORY_TRACT | Status: DC | PRN

## 2015-04-13 MED ORDER — FLUTICASONE PROPIONATE 50 MCG/ACT NA SUSP: 2.0000 | Freq: Every day | NASAL | Status: DC

## 2015-04-13 MED ORDER — BUDESONIDE-FORMOTEROL FUMARATE 160-4.5 MCG/ACT IN AERO: 2.0000 | INHALATION_SPRAY | Freq: Two times a day (BID) | RESPIRATORY_TRACT | Status: DC

## 2015-04-13 MED ORDER — MONTELUKAST SODIUM 10 MG PO TABS: 10.0000 mg | ORAL_TABLET | Freq: Every day | ORAL | Status: DC

## 2015-04-13 MED ORDER — METHYLPREDNISOLONE ACETATE 80 MG/ML IJ SUSP
80.0000 mg | Freq: Once | INTRAMUSCULAR | Status: AC
  Administered 2015-04-13: 80 mg via INTRAMUSCULAR

## 2015-04-13 NOTE — Progress Notes (Signed)
Subjective:     [name] is a 59 y.o. male who presents for evaluation of sore throat. Associated symptoms include chest congestion, chest pain during cough, headache, post nasal drip, productive cough and sinus and nasal congestion. Onset of symptoms was 7 days ago, and have been gradually worsening since that time. He is drinking plenty of fluids. He has not had a recent close exposure to someone with proven streptococcal pharyngitis.  The following portions of the patient's history were reviewed and updated as appropriate: allergies, current medications, past family history, past medical history, past social history, past surgical history and problem list.  Review of Systems Pertinent items are noted in HPI.    Objective:    General appearance: alert and cooperative Eyes: conjunctivae/corneas clear. PERRL, EOM's intact. Fundi benign. Ears: normal TM's and external ear canals both ears Nose: Nares normal. Septum midline. Mucosa normal. No drainage or sinus tenderness., clear discharge, moderate congestion, no sinus tenderness Throat: lips, mucosa, and tongue normal; teeth and gums normal Neck: no adenopathy, no carotid bruit, no JVD, supple, symmetrical, trachea midline and thyroid not enlarged, symmetric, no tenderness/mass/nodules Lungs: clear to auscultation bilaterally Heart: regular rate and rhythm, S1, S2 normal, no murmur, click, rub or gallop  Laboratory Strep test not done.   Assessment:    Acute pharyngitis, likely  Rhinitis with post nasal drip.    Plan:  1. Take meds as prescribed 2. Use a cool mist humidifier especially during the winter months and when heat has been humid. 3. Use saline nose sprays frequently 4. Saline irrigations of the nose can be very helpful if done frequently.  * 4X daily for 1 week*  * Use of a nettie pot can be helpful with this. Follow directions with this* 5. Drink plenty of fluids 6. Keep thermostat turn down low 7.For any cough  or congestion  Use plain Mucinex- regular strength or max strength is fine   * Children- consult with Pharmacist for dosing 8. For fever or aces or pains- take tylenol or ibuprofen appropriate for age and weight.  * for fevers greater than 101 orally you may alternate ibuprofen and tylenol every  3 hours.     Meds ordered this encounter  Medications  . methylPREDNISolone acetate (DEPO-MEDROL) injection 80 mg    Sig:   . fluticasone (FLONASE) 50 MCG/ACT nasal spray    Sig: Place 2 sprays into both nostrils daily.    Dispense:  16 g    Refill:  6    Order Specific Question:  Supervising Provider    Answer:  Ernestina PennaMOORE, DONALD W [[id number]]  . albuterol (PROVENTIL HFA;VENTOLIN HFA) 108 (90 BASE) MCG/ACT inhaler    Sig: Inhale 2 puffs into the lungs every 6 (six) hours as needed for wheezing.    Dispense:  18 g    Refill:  4    Order Specific Question:  Supervising Provider    Answer:  Ernestina PennaMOORE, DONALD W [[id number]]  . budesonide-formoterol (SYMBICORT) 160-4.5 MCG/ACT inhaler    Sig: Inhale 2 puffs into the lungs 2 (two) times daily.    Dispense:  1 Inhaler    Refill:  3    Order Specific Question:  Supervising Provider    Answer:  Ernestina PennaMOORE, DONALD W [[id number]]  . montelukast (SINGULAIR) 10 MG tablet    Sig: Take 1 tablet (10 mg total) by mouth at bedtime.    Dispense:  30 tablet    Refill:  3    Order Specific Question:  Supervising Provider    Answer:  Ernestina PennaMOORE, DONALD W [[id number]]   Mary-Margaret Daphine DeutscherMartin, FNP

## 2015-04-13 NOTE — Patient Instructions (Signed)

## 2015-12-26 IMAGING — CR DG CHEST 2V
2 series · 2 of 2 positions shown · non-contrast
Comparison: 09/25/2013

CLINICAL DATA: COPD

EXAM:
CHEST  2 VIEW

[view not recorded (1 of 2)]
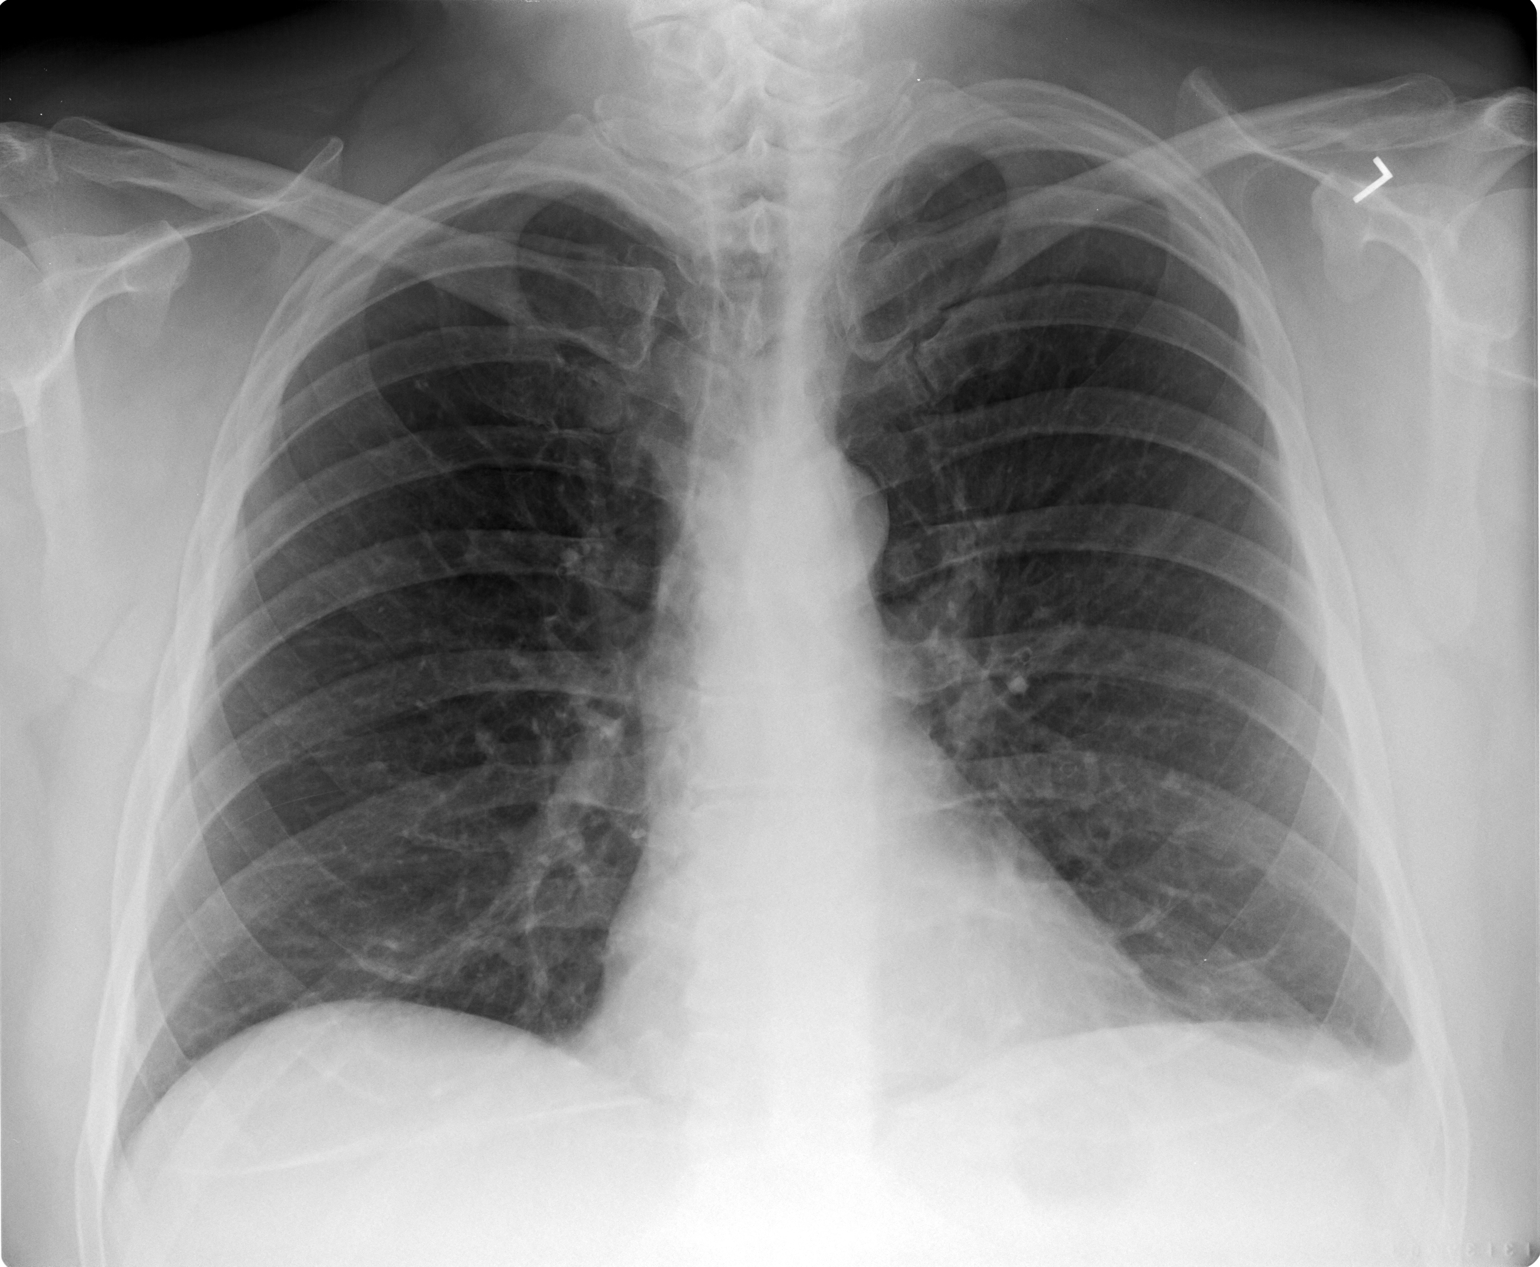

[view not recorded (2 of 2)]
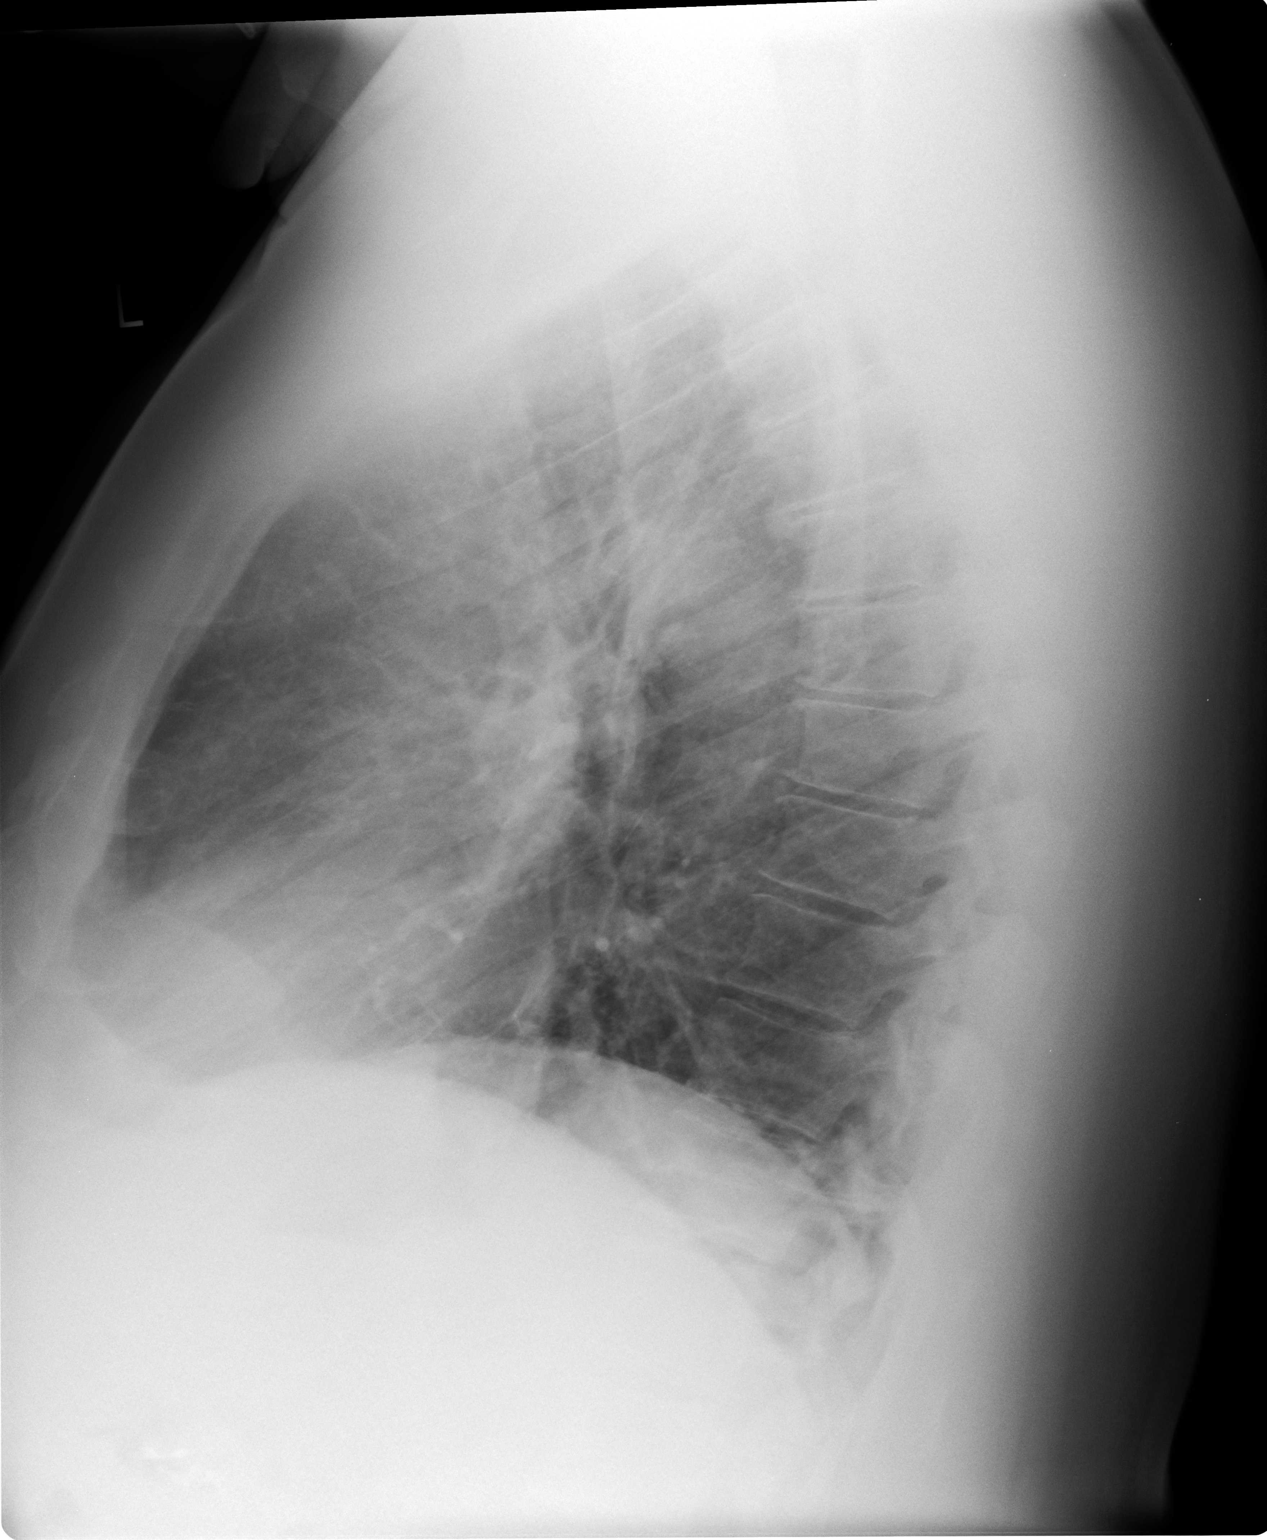

[2 of 2 positions shown; findings below may reference images not displayed]

FINDINGS: Normal heart size, mediastinal contours, and pulmonary vascularity.

Minimal peribronchial thickening.

Left basilar scarring and tiny left pleural effusion.

No infiltrate or pneumothorax.

Bones unremarkable.
IMPRESSION: Minimal bronchitic changes with left basilar scarring and minimal
left pleural effusion.

## 2017-01-29 ENCOUNTER — Telehealth: Payer: Self-pay | Admitting: Family Medicine

## 2017-01-29 NOTE — Telephone Encounter (Signed)
Please contact the patient that I would love to help, except it's been too long since his last visit to the office. 2 years since he came in for an illness and 3 since he came in for a checkup. Any of us can see him for this however.

## 2017-01-30 ENCOUNTER — Encounter: Payer: Self-pay | Admitting: Pediatrics

## 2017-01-30 ENCOUNTER — Ambulatory Visit (INDEPENDENT_AMBULATORY_CARE_PROVIDER_SITE_OTHER): Payer: BLUE CROSS/BLUE SHIELD | Admitting: Pediatrics

## 2017-01-30 VITALS — BP 151/90 | HR 101 | Temp 102.1°F | Ht 72.0 in | Wt 282.0 lb

## 2017-01-30 DIAGNOSIS — J101 Influenza due to other identified influenza virus with other respiratory manifestations: Secondary | ICD-10-CM | POA: Diagnosis not present

## 2017-01-30 DIAGNOSIS — R509 Fever, unspecified: Secondary | ICD-10-CM

## 2017-01-30 DIAGNOSIS — R739 Hyperglycemia, unspecified: Secondary | ICD-10-CM | POA: Diagnosis not present

## 2017-01-30 DIAGNOSIS — Z6838 Body mass index (BMI) 38.0-38.9, adult: Secondary | ICD-10-CM

## 2017-01-30 LAB — VERITOR FLU A/B WAIVED
Influenza A: POSITIVE — AB
Influenza B: NEGATIVE

## 2017-01-30 LAB — BAYER DCA HB A1C WAIVED: HB A1C: 5.7 % (ref <7.0)

## 2017-01-30 MED ORDER — OSELTAMIVIR PHOSPHATE 75 MG PO CAPS: 75.0000 mg | ORAL_CAPSULE | Freq: Two times a day (BID) | ORAL | 0 refills | Status: AC

## 2017-01-30 NOTE — Telephone Encounter (Signed)
Spoke with [name] and advised he would need to be seen since he hasn't been in the office in 2 years. [name] scheduled with Dr. Oswaldo DoneVincent at 4:15 this afternoon.

## 2017-01-30 NOTE — Patient Instructions (Signed)
Netipot with distilled water 2-3 times a day to clear out sinuses Or Normal saline nasal spray Flonase steroid nasal spray Antihistamine daily such as cetirizine Lots of fluids  

## 2017-01-30 NOTE — Progress Notes (Signed)
  Subjective:   Patient ID: [name], male    DOB: 02/28/1956, 61 y.o.   MRN: [id number] CC: Fever (pt here today c/o fever and his granddaughter was dx'd with the flu Monday and she is staying with him. He now has a fever of 101.)  HPI: [name] is a 61 y.o. male presenting for Fever (pt here today c/o fever and his granddaughter was dx'd with the flu Monday and she is staying with him. He now has a fever of 101.)  Felt bad yesterday Now with sinus pressure Fevers Coughing some Appetite slightly down Taking sudafed, has helped some  Due for well exam  Relevant past medical, surgical, family and social history reviewed. Allergies and medications reviewed and updated. History  Smoking Status  . Former Smoker  . Packs/day: 1.00  . Years: 15.00  . Types: Cigarettes  . Quit date: 12/17/2003  Smokeless Tobacco  . Never Used   ROS: Per HPI   Objective:    BP (!) 151/90   Pulse (!) 101   Temp (!) 102.1 F (38.9 C) (Oral)   Ht 6' (1.829 m)   Wt 282 lb (127.9 kg)   BMI 38.25 kg/m   Wt Readings from Last 3 Encounters:  01/30/17 282 lb (127.9 kg)  04/13/15 271 lb 9.6 oz (123.2 kg)  04/21/14 283 lb 9.6 oz (128.6 kg)    Gen: NAD, alert, cooperative with exam, NCAT, congested EYES: EOMI, no conjunctival injection, or no icterus ENT:  TMs dull gray b/l, OP without erythema LYMPH: no cervical LAD CV: NRRR, normal S1/S2, no murmur, distal pulses 2+ b/l Resp: CTABL, no wheezes, normal WOB Abd: +BS, soft, NTND.  Ext: No edema, warm Neuro: Alert and oriented  Assessment & Plan:  Taite was seen today for fever.  Diagnoses and all orders for this visit:  Influenza A Flu test positive Start below -     oseltamivir (TAMIFLU) 75 MG capsule; Take 1 capsule (75 mg total) by mouth 2 (two) times daily.  Fever, unspecified fever cause -     Veritor Flu A/B Waived  Hyperglycemia Check below, f/u with well exam -     Bayer DCA Hb A1c Waived  BMI 38.0-38.9,adult Pt  to schedule f/u well exam -     CMP14+EGFR -     Lipid panel   Follow up plan: Return in about 2 weeks (around 02/13/2017) for complete physical exam. Assunta Found, MD [place]

## 2017-01-31 LAB — LIPID PANEL
CHOL/HDL RATIO: 4.3 ratio (ref 0.0–5.0)
CHOLESTEROL TOTAL: 138 mg/dL (ref 100–199)
HDL: 32 mg/dL — ABNORMAL LOW (ref >39)
LDL CALC: 65 mg/dL (ref 0–99)
TRIGLYCERIDES: 205 mg/dL — AB (ref 0–149)
VLDL Cholesterol Cal: 41 mg/dL — ABNORMAL HIGH (ref 5–40)

## 2017-01-31 LAB — CMP14+EGFR
ALK PHOS: 94 IU/L (ref 39–117)
ALT: 31 IU/L (ref 0–44)
AST: 29 IU/L (ref 0–40)
Albumin/Globulin Ratio: 1.3 (ref 1.2–2.2)
Albumin: 4.1 g/dL (ref 3.5–5.5)
BUN / CREAT RATIO: 4 — AB (ref 9–20)
BUN: 5 mg/dL — ABNORMAL LOW (ref 6–24)
Bilirubin Total: 0.6 mg/dL (ref 0.0–1.2)
CO2: 25 mmol/L (ref 18–29)
CREATININE: 1.22 mg/dL (ref 0.76–1.27)
Calcium: 9 mg/dL (ref 8.7–10.2)
Chloride: 94 mmol/L — ABNORMAL LOW (ref 96–106)
GFR calc Af Amer: 75 mL/min/{1.73_m2} (ref >59)
GFR, EST NON AFRICAN AMERICAN: 65 mL/min/{1.73_m2} (ref >59)
GLOBULIN, TOTAL: 3.2 g/dL (ref 1.5–4.5)
Glucose: 77 mg/dL (ref 65–99)
Potassium: 3.9 mmol/L (ref 3.5–5.2)
SODIUM: 136 mmol/L (ref 134–144)
Total Protein: 7.3 g/dL (ref 6.0–8.5)

## 2017-01-31 NOTE — Telephone Encounter (Signed)
Pt was seen in the office with Dr. Oswaldo DoneVincent yesterday.

## 2017-02-21 ENCOUNTER — Other Ambulatory Visit: Payer: Self-pay | Admitting: Nurse Practitioner

## 2017-02-21 DIAGNOSIS — J4521 Mild intermittent asthma with (acute) exacerbation: Secondary | ICD-10-CM

## 2018-03-31 ENCOUNTER — Ambulatory Visit (INDEPENDENT_AMBULATORY_CARE_PROVIDER_SITE_OTHER): Payer: Managed Care, Other (non HMO) | Admitting: Physician Assistant

## 2018-03-31 ENCOUNTER — Encounter: Payer: Self-pay | Admitting: Physician Assistant

## 2018-03-31 VITALS — BP 130/81 | HR 75 | Temp 97.3°F | Ht 72.0 in | Wt 276.2 lb

## 2018-03-31 DIAGNOSIS — T7840XA Allergy, unspecified, initial encounter: Secondary | ICD-10-CM | POA: Diagnosis not present

## 2018-03-31 DIAGNOSIS — B354 Tinea corporis: Secondary | ICD-10-CM | POA: Diagnosis not present

## 2018-03-31 DIAGNOSIS — J4540 Moderate persistent asthma, uncomplicated: Secondary | ICD-10-CM

## 2018-03-31 DIAGNOSIS — L309 Dermatitis, unspecified: Secondary | ICD-10-CM

## 2018-03-31 MED ORDER — CICLOPIROX OLAMINE 0.77 % EX CREA
TOPICAL_CREAM | Freq: Two times a day (BID) | CUTANEOUS | 2 refills | Status: DC
Start: 2018-03-31 — End: 2023-07-29

## 2018-03-31 MED ORDER — FLUTICASONE PROPIONATE 50 MCG/ACT NA SUSP: 2.0000 | Freq: Every day | NASAL | 11 refills | Status: DC

## 2018-03-31 MED ORDER — BUDESONIDE-FORMOTEROL FUMARATE 160-4.5 MCG/ACT IN AERO: 2.0000 | INHALATION_SPRAY | Freq: Two times a day (BID) | RESPIRATORY_TRACT | 11 refills | Status: DC

## 2018-03-31 MED ORDER — ALBUTEROL SULFATE HFA 108 (90 BASE) MCG/ACT IN AERS: 2.0000 | INHALATION_SPRAY | Freq: Four times a day (QID) | RESPIRATORY_TRACT | 5 refills | Status: DC | PRN

## 2018-03-31 MED ORDER — TERBINAFINE HCL 250 MG PO TABS
250.0000 mg | ORAL_TABLET | Freq: Every day | ORAL | 2 refills | Status: DC
Start: 2018-03-31 — End: 2021-11-12

## 2018-03-31 MED ORDER — BETAMETHASONE VALERATE 0.1 % EX OINT: 1.0000 "application " | TOPICAL_OINTMENT | Freq: Two times a day (BID) | CUTANEOUS | 5 refills | Status: DC

## 2018-04-01 NOTE — Progress Notes (Signed)
BP 130/81   Pulse 75   Temp (!) 97.3 F (36.3 C) (Oral)   Ht 6' (1.829 m)   Wt 276 lb 3.2 oz (125.3 kg)   BMI 37.46 kg/m    Subjective:    Patient ID: [name], male    DOB: 1956-02-04, 62 y.o.   MRN: [id number]  HPI: [name] is a 62 y.o. male presenting on 03/31/2018 for Rash  Patient comes in for chronic conditions including his asthma and allergic rhinitis.  He is currently living most of the time in [place] for his job.  He does come home every few months.  The allergies have been quite bad for about a month now.  He also has a chronic rash on his abdomen.  There is one area on the left hip that is dry white and itches at times.  The other areas is midline and has burning whenever he tries to apply over-the-counter medications.  He occasionally has a few crusting areas on the right cheek.  He does have a old history of eczema as a child.  Past Medical History:  Diagnosis Date  . Asthma   . BPH (benign prostatic hyperplasia)   . Obesity    Relevant past medical, surgical, family and social history reviewed and updated as indicated. Interim medical history since our last visit reviewed. Allergies and medications reviewed and updated. DATA REVIEWED: CHART IN EPIC  Family History reviewed for pertinent findings.  Review of Systems  Constitutional: Negative.  Negative for appetite change and fatigue.  HENT: Positive for congestion, postnasal drip, rhinorrhea and sneezing.   Eyes: Negative for pain and visual disturbance.  Respiratory: Positive for shortness of breath. Negative for cough, chest tightness and wheezing.   Cardiovascular: Negative.  Negative for chest pain, palpitations and leg swelling.  Gastrointestinal: Negative.  Negative for abdominal pain, diarrhea, nausea and vomiting.  Genitourinary: Negative.   Skin: Positive for color change and rash.  Neurological: Negative.  Negative for weakness, numbness and headaches.  Psychiatric/Behavioral:  Negative.     Allergies as of 03/31/2018   No Known Allergies     Medication List        Accurate as of 03/31/18 11:59 PM. Always use your most recent med list.          albuterol 108 (90 Base) MCG/ACT inhaler Commonly known as:  PROVENTIL HFA;VENTOLIN HFA Inhale 2 puffs into the lungs every 6 (six) hours as needed for wheezing.   betamethasone valerate ointment 0.1 % Commonly known as:  VALISONE Apply 1 application topically 2 (two) times daily.   budesonide-formoterol 160-4.5 MCG/ACT inhaler Commonly known as:  SYMBICORT Inhale 2 puffs into the lungs 2 (two) times daily.   ciclopirox 0.77 % cream Commonly known as:  LOPROX Apply topically 2 (two) times daily. Fungal infection   fluticasone 50 MCG/ACT nasal spray Commonly known as:  FLONASE Place 2 sprays into both nostrils daily.   loratadine 10 MG tablet Commonly known as:  CLARITIN Take 10 mg by mouth 2 (two) times daily.   montelukast 10 MG tablet Commonly known as:  SINGULAIR Take 1 tablet (10 mg total) by mouth at bedtime.   terbinafine 250 MG tablet Commonly known as:  LAMISIL Take 1 tablet (250 mg total) by mouth daily.          Objective:    BP 130/81   Pulse 75   Temp (!) 97.3 F (36.3 C) (Oral)   Ht 6' (1.829 m)  Wt 276 lb 3.2 oz (125.3 kg)   BMI 37.46 kg/m   No Known Allergies  Wt Readings from Last 3 Encounters:  03/31/18 276 lb 3.2 oz (125.3 kg)  01/30/17 282 lb (127.9 kg)  04/13/15 271 lb 9.6 oz (123.2 kg)    Physical Exam  Constitutional: He appears well-developed and well-nourished. No distress.  HENT:  Head: Normocephalic and atraumatic.  Eyes: Pupils are equal, round, and reactive to light. Conjunctivae and EOM are normal.  Cardiovascular: Normal rate, regular rhythm and normal heart sounds.  Pulmonary/Chest: Effort normal and breath sounds normal. No respiratory distress.  Skin: Skin is warm and dry. Lesion noted. There is erythema.     Midline lesion is red with  surrounding white scaling.  The center is red with thinness and an open wound.  The left hip has a patch of scaling skin that does not have erythema or drainage.  Psychiatric: He has a normal mood and affect. His behavior is normal.  Nursing note and vitals reviewed.   Results for orders placed or performed in visit on 01/30/17  Veritor Flu A/B Waived  Result Value Ref Range   Influenza A Positive (A) Negative   Influenza B Negative Negative  CMP14+EGFR  Result Value Ref Range   Glucose 77 65 - 99 mg/dL   BUN 5 (L) 6 - 24 mg/dL   Creatinine, Ser 1.22 0.76 - 1.27 mg/dL   GFR calc non Af Amer 65 >59 mL/min/1.73   GFR calc Af Amer 75 >59 mL/min/1.73   BUN/Creatinine Ratio 4 (L) 9 - 20   Sodium 136 134 - 144 mmol/L   Potassium 3.9 3.5 - 5.2 mmol/L   Chloride 94 (L) 96 - 106 mmol/L   CO2 25 18 - 29 mmol/L   Calcium 9.0 8.7 - 10.2 mg/dL   Total Protein 7.3 6.0 - 8.5 g/dL   Albumin 4.1 3.5 - 5.5 g/dL   Globulin, Total 3.2 1.5 - 4.5 g/dL   Albumin/Globulin Ratio 1.3 1.2 - 2.2   Bilirubin Total 0.6 0.0 - 1.2 mg/dL   Alkaline Phosphatase 94 39 - 117 IU/L   AST 29 0 - 40 IU/L   ALT 31 0 - 44 IU/L  Bayer DCA Hb A1c Waived  Result Value Ref Range   Bayer DCA Hb A1c Waived 5.7 <7.0 %  Lipid panel  Result Value Ref Range   Cholesterol, Total 138 100 - 199 mg/dL   Triglycerides 205 (H) 0 - 149 mg/dL   HDL 32 (L) >39 mg/dL   VLDL Cholesterol Cal 41 (H) 5 - 40 mg/dL   LDL Calculated 65 0 - 99 mg/dL   Chol/HDL Ratio 4.3 0.0 - 5.0 ratio units      Assessment & Plan:   1. Moderate persistent asthma, unspecified whether complicated - albuterol (PROVENTIL HFA;VENTOLIN HFA) 108 (90 Base) MCG/ACT inhaler; Inhale 2 puffs into the lungs every 6 (six) hours as needed for wheezing.  Dispense: 18 g; Refill: 5 - budesonide-formoterol (SYMBICORT) 160-4.5 MCG/ACT inhaler; Inhale 2 puffs into the lungs 2 (two) times daily.  Dispense: 1 Inhaler; Refill: 11  2. Allergic reaction, initial encounter -  fluticasone (FLONASE) 50 MCG/ACT nasal spray; Place 2 sprays into both nostrils daily.  Dispense: 16 g; Refill: 11  3. Eczema, unspecified type - betamethasone valerate ointment (VALISONE) 0.1 %; Apply 1 application topically 2 (two) times daily.  Dispense: 45 g; Refill: 5  4. Tinea corporis - terbinafine (LAMISIL) 250 MG tablet; Take 1 tablet (250  mg total) by mouth daily.  Dispense: 14 tablet; Refill: 2 - ciclopirox (LOPROX) 0.77 % cream; Apply topically 2 (two) times daily. Fungal infection  Dispense: 30 g; Refill: 2   Continue all other maintenance medications as listed above.  Follow up plan: No follow-ups on file.  Educational handout given for [place] PA-C [facility] [address]  [place], [place] [address] [phone]   04/01/2018, 12:33 PM

## 2018-05-24 ENCOUNTER — Other Ambulatory Visit: Payer: Self-pay | Admitting: Physician Assistant

## 2018-05-24 DIAGNOSIS — B354 Tinea corporis: Secondary | ICD-10-CM

## 2019-11-03 ENCOUNTER — Other Ambulatory Visit: Payer: Self-pay

## 2019-11-03 ENCOUNTER — Encounter: Payer: Self-pay | Admitting: Family Medicine

## 2019-11-03 ENCOUNTER — Ambulatory Visit (INDEPENDENT_AMBULATORY_CARE_PROVIDER_SITE_OTHER): Payer: 59 | Admitting: Family Medicine

## 2019-11-03 DIAGNOSIS — J4540 Moderate persistent asthma, uncomplicated: Secondary | ICD-10-CM | POA: Diagnosis not present

## 2019-11-03 DIAGNOSIS — H109 Unspecified conjunctivitis: Secondary | ICD-10-CM | POA: Diagnosis not present

## 2019-11-03 DIAGNOSIS — B372 Candidiasis of skin and nail: Secondary | ICD-10-CM | POA: Diagnosis not present

## 2019-11-03 MED ORDER — ALBUTEROL SULFATE HFA 108 (90 BASE) MCG/ACT IN AERS: 2.0000 | INHALATION_SPRAY | Freq: Four times a day (QID) | RESPIRATORY_TRACT | 0 refills | Status: DC | PRN

## 2019-11-03 MED ORDER — NYSTATIN 100000 UNIT/GM EX POWD: Freq: Three times a day (TID) | CUTANEOUS | 0 refills | Status: DC

## 2019-11-03 MED ORDER — BUDESONIDE-FORMOTEROL FUMARATE 160-4.5 MCG/ACT IN AERO: 2.0000 | INHALATION_SPRAY | Freq: Two times a day (BID) | RESPIRATORY_TRACT | 3 refills | Status: DC

## 2019-11-03 MED ORDER — ERYTHROMYCIN 5 MG/GM OP OINT: 1.0000 "application " | TOPICAL_OINTMENT | Freq: Four times a day (QID) | OPHTHALMIC | 0 refills | Status: DC

## 2019-11-03 NOTE — Patient Instructions (Signed)
Bacterial Conjunctivitis, Adult Bacterial conjunctivitis is an infection of your conjunctiva. This is the clear membrane that covers the white part of your eye and the inner part of your eyelid. This infection can make your eye:  Red or pink.  Itchy. This condition spreads easily from person to person (is contagious) and from one eye to the other eye. What are the causes?  This condition is caused by germs (bacteria). You may get the infection if you come into close contact with: ? A person who has the infection. ? Items that have germs on them (are contaminated), such as face towels, contact lens solution, or eye makeup. What increases the risk? You are more likely to get this condition if you:  Have contact with people who have the infection.  Wear contact lenses.  Have a sinus infection.  Have had a recent eye injury or surgery.  Have a weak body defense system (immune system).  Have dry eyes. What are the signs or symptoms?   Thick, yellowish discharge from the eye.  Tearing or watery eyes.  Itchy eyes.  Burning feeling in your eyes.  Eye redness.  Swollen eyelids.  Blurred vision. How is this treated?   Antibiotic eye drops or ointment.  Antibiotic medicine taken by mouth. This is used for infections that do not get better with drops or ointment or that last more than 10 days.  Cool, wet cloths placed on the eyes.  Artificial tears used 2-6 times a day. Follow these instructions at home: Medicines  Take or apply your antibiotic medicine as told by your doctor. Do not stop taking or applying the antibiotic even if you start to feel better.  Take or apply over-the-counter and prescription medicines only as told by your doctor.  Do not touch your eyelid with the eye-drop bottle or the ointment tube. Managing discomfort  Wipe any fluid from your eye with a warm, wet washcloth or a cotton ball.  Place a clean, cool, wet cloth on your eye. Do this  for 10-20 minutes, 3-4 times per day. General instructions  Do not wear contacts until the infection is gone. Wear glasses until your doctor says it is okay to wear contacts again.  Do not wear eye makeup until the infection is gone. Throw away old eye makeup.  Change or wash your pillowcase every day.  Do not share towels or washcloths.  Wash your hands often with soap and water. Use paper towels to dry your hands.  Do not touch or rub your eyes.  Do not drive or use heavy machinery if your vision is blurred. Contact a doctor if:  You have a fever.  You do not get better after 10 days. Get help right away if:  You have a fever and your symptoms get worse all of a sudden.  You have very bad pain when you move your eye.  Your face: ? Hurts. ? Is red. ? Is swollen.  You have sudden loss of vision. Summary  Bacterial conjunctivitis is an infection of your conjunctiva.  This infection spreads easily from person to person.  Wash your hands often with soap and water. Use paper towels to dry your hands.  Take or apply your antibiotic medicine as told by your doctor.  Contact a doctor if you have a fever or you do not get better after 10 days. This information is not intended to replace advice given to you by your health care provider. Make sure you discuss  any questions you have with your health care provider. Document Released: 09/10/2008 Document Revised: 03/23/2019 Document Reviewed: 07/08/2018 Elsevier Patient Education  2020 [place] is skin irritation (inflammation) that happens in warm, moist areas of the body. The irritation can cause a rash and make skin raw and itchy. The rash is usually pink or red. It happens mostly between folds of skin or where skin rubs together, such as:  Between the toes.  In the armpits.  In the groin area.  Under the belly.  Under the breasts.  Around the butt area. This condition is not passed  from person to person (is not contagious). What are the causes?  Heat, moisture, rubbing, and not enough air movement.  The condition can be made worse by: ? Sweat. ? Bacteria. ? A fungus, such as yeast. What increases the risk?  Moisture in your skin folds.  You are more likely to develop this condition if you: ? Have diabetes. ? Are overweight. ? Are not able to move around. ? Live in a warm and moist climate. ? Wear splints, braces, or other medical devices. ? Are not able to control your pee (urine) or poop (stool). What are the signs or symptoms?  A pink or red skin rash in the skin fold or near the skin fold.  Raw or scaly skin.  Itching.  A burning feeling.  Bleeding.  Leaking fluid.  A bad smell. How is this treated?  Cleaning and drying your skin.  Taking an antibiotic medicine or using an antibiotic skin cream for a bacterial infection.  Using an antifungal cream on your skin or taking pills for an infection that was caused by a fungus, such as yeast.  Using a steroid ointment to stop the itching and irritation.  Separating the skin fold with a clean cotton cloth to absorb moisture and allow air to flow into the area. Follow these instructions at home:  Keep the affected area clean and dry.  Do not scratch your skin.  Stay cool as much as you can. Use an air conditioner or a fan, if you have one.  Apply over-the-counter and prescription medicines only as told by your doctor.  If you were prescribed an antibiotic medicine, use it as told by your doctor. Do not stop using the antibiotic even if your condition starts to get better.  Keep all follow-up visits as told by your doctor. This is important. How is this prevented?   Stay at a healthy weight.  Take care of your feet. This is very important if you have diabetes. You should: ? Wear shoes that fit well. ? Keep your feet dry. ? Wear clean cotton or wool socks.  Protect the skin in your  groin and butt area as told by your doctor. To do this: ? Follow a regular cleaning routine. ? Use creams, powders, or ointments that protect your skin. ? Change protection pads often.  Do not wear tight clothes. Wear clothes that: ? Are loose. ? Take moisture away from your body. ? Are made of cotton.  Wear a bra that gives good support, if needed.  Shower and dry yourself well after being active. Use a hair dryer on a cool setting to dry between skin folds.  Keep your blood sugar under control if you have diabetes. Contact a doctor if:  Your symptoms do not get better with treatment.  Your symptoms get worse or they spread.  You notice more redness and  warmth.  You have a fever. Summary  Intertrigo is skin irritation that occurs when folds of skin rub together.  This condition is caused by heat, moisture, and rubbing.  This condition may be treated by cleaning and drying your skin and with medicines.  Apply over-the-counter and prescription medicines only as told by your doctor.  Keep all follow-up visits as told by your doctor. This is important. This information is not intended to replace advice given to you by your health care provider. Make sure you discuss any questions you have with your health care provider. Document Released: 01/04/2011 Document Revised: 09/10/2018 Document Reviewed: 09/10/2018 Elsevier Patient Education  2020 [employer]

## 2019-11-03 NOTE — Progress Notes (Signed)
Virtual Visit via Telephone Note  I connected with [name] on 11/03/19 at 11:15 AM by telephone and verified that I am speaking with the correct person using two identifiers. [name] is currently located at home and nobody is currently with him during this visit. The provider, Gwenlyn Fudge, FNP is located in their home at time of visit.  I discussed the limitations, risks, security and privacy concerns of performing an evaluation and management service by telephone and the availability of in person appointments. I also discussed with the patient that there may be a patient responsible charge related to this service. The patient expressed understanding and agreed to proceed.  Subjective: PCP: Mechele Claude, MD  Chief Complaint  Patient presents with  . Conjunctivitis   Patient reports burning, itching, and purulent drainage that started in his left eye and is now in both eyes. The drainage is light green in color. His eyes are matted shut in the mornings. He has to clean them with warm compresses numerous times throughout the day. He has tried using Pataday drops that he has was previously prescribed but states there has been no improvement with this.   Patient also concerned that he has a yeast infection in his groin folds. He reports this happens when he gets really sweaty working down in [place]. The folds are erythematous and look "yeasty". He has tried applying Monistat which was not effective.   Patient is requesting a three month supply on inhalers so that insurance will pay.    ROS: Per HPI  Current Outpatient Medications:  .  albuterol (PROVENTIL HFA;VENTOLIN HFA) 108 (90 Base) MCG/ACT inhaler, Inhale 2 puffs into the lungs every 6 (six) hours as needed for wheezing., Disp: 18 g, Rfl: 5 .  betamethasone valerate ointment (VALISONE) 0.1 %, Apply 1 application topically 2 (two) times daily., Disp: 45 g, Rfl: 5 .  budesonide-formoterol (SYMBICORT) 160-4.5 MCG/ACT  inhaler, Inhale 2 puffs into the lungs 2 (two) times daily., Disp: 1 Inhaler, Rfl: 11 .  ciclopirox (LOPROX) 0.77 % cream, Apply topically 2 (two) times daily. Fungal infection, Disp: 30 g, Rfl: 2 .  fluticasone (FLONASE) 50 MCG/ACT nasal spray, Place 2 sprays into both nostrils daily., Disp: 16 g, Rfl: 11 .  loratadine (CLARITIN) 10 MG tablet, Take 10 mg by mouth 2 (two) times daily., Disp: , Rfl:  .  montelukast (SINGULAIR) 10 MG tablet, Take 1 tablet (10 mg total) by mouth at bedtime., Disp: 30 tablet, Rfl: 3 .  terbinafine (LAMISIL) 250 MG tablet, Take 1 tablet (250 mg total) by mouth daily., Disp: 14 tablet, Rfl: 2  No Known Allergies Past Medical History:  Diagnosis Date  . Asthma   . BPH (benign prostatic hyperplasia)   . Obesity     Observations/Objective: A&O  No respiratory distress or wheezing audible over the phone Mood, judgement, and thought processes all WNL  Assessment and Plan: 1. Bacterial conjunctivitis of both eyes - Education provided on bacterial conjunctivitis.  - erythromycin ophthalmic ointment; Place 1 application into both eyes 4 (four) times daily for 7 days.  Dispense: 3.5 g; Refill: 0  2. Intertriginous candidiasis - Education provided on intertrigo. Discussed due to moisture.  - nystatin (MYCOSTATIN/NYSTOP) powder; Apply topically 3 (three) times daily.  Dispense: 60 g; Refill: 0  3. Moderate persistent asthma, unspecified whether complicated - Inhalers sent in 3 month supplies for insurance purposes.  - budesonide-formoterol (SYMBICORT) 160-4.5 MCG/ACT inhaler; Inhale 2 puffs into the lungs 2 (  two) times daily.  Dispense: 3 Inhaler; Refill: 3 - albuterol (VENTOLIN HFA) 108 (90 Base) MCG/ACT inhaler; Inhale 2 puffs into the lungs every 6 (six) hours as needed for wheezing or shortness of breath.  Dispense: 54 g; Refill: 0   Follow Up Instructions:  I discussed the assessment and treatment plan with the patient. The patient was provided an  opportunity to ask questions and all were answered. The patient agreed with the plan and demonstrated an understanding of the instructions.   The patient was advised to call back or seek an in-person evaluation if the symptoms worsen or if the condition fails to improve as anticipated.  The above assessment and management plan was discussed with the patient. The patient verbalized understanding of and has agreed to the management plan. Patient is aware to call the clinic if symptoms persist or worsen. Patient is aware when to return to the clinic for a follow-up visit. Patient educated on when it is appropriate to go to the emergency department.   Time call ended: 11:35 AM  I provided 20 minutes of non-face-to-face time during this encounter.  Hendricks Limes, MSN, APRN, FNP-C [facility] 11/03/19

## 2019-11-04 ENCOUNTER — Telehealth: Payer: Self-pay | Admitting: Family Medicine

## 2019-11-04 DIAGNOSIS — H109 Unspecified conjunctivitis: Secondary | ICD-10-CM

## 2019-11-04 MED ORDER — POLYMYXIN B-TRIMETHOPRIM 10000-0.1 UNIT/ML-% OP SOLN: 2.0000 [drp] | Freq: Four times a day (QID) | OPHTHALMIC | 0 refills | Status: AC

## 2019-11-04 NOTE — Telephone Encounter (Signed)
Patient aware.

## 2020-03-10 ENCOUNTER — Telehealth: Payer: Self-pay | Admitting: Family Medicine

## 2020-03-10 ENCOUNTER — Telehealth (INDEPENDENT_AMBULATORY_CARE_PROVIDER_SITE_OTHER): Payer: 59 | Admitting: Family

## 2020-03-10 ENCOUNTER — Encounter: Payer: Self-pay | Admitting: Family

## 2020-03-10 DIAGNOSIS — M7032 Other bursitis of elbow, left elbow: Secondary | ICD-10-CM | POA: Diagnosis not present

## 2020-03-10 DIAGNOSIS — B372 Candidiasis of skin and nail: Secondary | ICD-10-CM | POA: Diagnosis not present

## 2020-03-10 MED ORDER — KETOCONAZOLE 2 % EX CREA: 1.0000 "application " | TOPICAL_CREAM | Freq: Every day | CUTANEOUS | 2 refills | Status: DC

## 2020-03-10 MED ORDER — DICLOFENAC SODIUM 75 MG PO TBEC: 75.0000 mg | DELAYED_RELEASE_TABLET | Freq: Two times a day (BID) | ORAL | 0 refills | Status: DC

## 2020-03-10 MED ORDER — FLUCONAZOLE 150 MG PO TABS: 150.0000 mg | ORAL_TABLET | ORAL | 0 refills | Status: DC | PRN

## 2020-03-10 MED ORDER — PREDNISONE 10 MG (21) PO TBPK: ORAL_TABLET | ORAL | 0 refills | Status: DC

## 2020-03-10 MED ORDER — NYSTATIN 100000 UNIT/GM EX POWD: Freq: Three times a day (TID) | CUTANEOUS | 2 refills | Status: DC

## 2020-03-10 NOTE — Progress Notes (Signed)
Virtual Visit via telephone Note Due to COVID-19 pandemic this visit was conducted virtually. This visit type was conducted due to national recommendations for restrictions regarding the COVID-19 Pandemic (e.g. social distancing, sheltering in place) in an effort to limit this patient's exposure and mitigate transmission in our community. All issues noted in this document were discussed and addressed.  A physical exam was not performed with this format.  I connected with [name] on 03/10/20 at 2:38 pm by telephone and verified that I am speaking with the correct person using two identifiers. [name] is currently located at home and no one is currently with him  during visit. The provider, Jannifer Rodney, FNP is located in their office at time of visit.  I discussed the limitations, risks, security and privacy concerns of performing an evaluation and management service by telephone and the availability of in person appointments. I also discussed with the patient that there may be a patient responsible charge related to this service. The patient expressed understanding and agreed to proceed.   History and Present Illness:  Pt calls the office today with left elbow tenderness and swelling. He reports he hit his elbow on a door frame a few weeks ago and since then has been having a "golf ball size swelling" and increased tenderness.  Arm Pain  The incident occurred more than 1 week ago. The injury mechanism was a direct blow. The pain is present in the left elbow. The quality of the pain is described as aching. The pain does not radiate. The pain is moderate. The pain has been intermittent since the incident. Pertinent negatives include no numbness or tingling.  Rash This is a recurrent problem. The current episode started 1 to 4 weeks ago. The problem has been waxing and waning since onset. The affected locations include the groin and abdomen. Treatments tried: nystatin powder. The  treatment provided mild relief.     Review of Systems  Skin: Positive for rash.  Neurological: Negative for tingling and numbness.     Observations/Objective: No SOB or distress noted   Assessment and Plan: 1. Bursitis of left elbow, unspecified bursa Rest Avoid injury No other NSAIDs while taking diclofenac  - predniSONE (STERAPRED UNI-PAK 21 TAB) 10 MG (21) TBPK tablet; Use as directed  Dispense: 21 tablet; Refill: 0 - diclofenac (VOLTAREN) 75 MG EC tablet; Take 1 tablet (75 mg total) by mouth 2 (two) times daily.  Dispense: 30 tablet; Refill: 0   2. Intertriginous candidiasis Keep clean and dry - ketoconazole (NIZORAL) 2 % cream; Apply 1 application topically daily.  Dispense: 60 g; Refill: 2 - nystatin (MYCOSTATIN/NYSTOP) powder; Apply topically 3 (three) times daily.  Dispense: 60 g; Refill: 2 - fluconazole (DIFLUCAN) 150 MG tablet; Take 1 tablet (150 mg total) by mouth every three (3) days as needed.  Dispense: 3 tablet; Refill: 0    I discussed the assessment and treatment plan with the patient. The patient was provided an opportunity to ask questions and all were answered. The patient agreed with the plan and demonstrated an understanding of the instructions.   The patient was advised to call back or seek an in-person evaluation if the symptoms worsen or if the condition fails to improve as anticipated.  The above assessment and management plan was discussed with the patient. The patient verbalized understanding of and has agreed to the management plan. Patient is aware to call the clinic if symptoms persist or worsen. Patient is aware when  to return to the clinic for a follow-up visit. Patient educated on when it is appropriate to go to the emergency department.   Time call ended:  2:51 pm   I provided 13 minutes of non-face-to-face time during this encounter.    Evelina Dun, FNP

## 2020-03-10 NOTE — Telephone Encounter (Signed)
Spoke to pt and advised Neysa Bonito will be able to see his elbow and the "raw spot" through the video. Pt voiced understanding and is ok with video visit.

## 2020-03-27 ENCOUNTER — Telehealth: Payer: Self-pay | Admitting: Family Medicine

## 2020-03-27 NOTE — Telephone Encounter (Signed)
Pt. Needs to be seen for this. Thanks, WS 

## 2020-03-27 NOTE — Telephone Encounter (Signed)
Patient states he went to [facility] yesterday.  Thinks he had an allergic reaction to Diclofenac.  States that Friday he started having right ear pain and ringing and sore throat.  States he last took diclofenac on Saturday night.  They did not give him any medications for the  ear pain or sore throat.  Please advise

## 2020-03-28 ENCOUNTER — Ambulatory Visit (INDEPENDENT_AMBULATORY_CARE_PROVIDER_SITE_OTHER): Payer: 59 | Admitting: Family Medicine

## 2020-03-28 ENCOUNTER — Encounter: Payer: Self-pay | Admitting: Family Medicine

## 2020-03-28 DIAGNOSIS — T7840XD Allergy, unspecified, subsequent encounter: Secondary | ICD-10-CM | POA: Diagnosis not present

## 2020-03-28 MED ORDER — FAMOTIDINE 20 MG PO TABS: 20.0000 mg | ORAL_TABLET | Freq: Two times a day (BID) | ORAL | 0 refills | Status: DC

## 2020-03-28 MED ORDER — PREDNISONE 20 MG PO TABS: ORAL_TABLET | ORAL | 0 refills | Status: DC

## 2020-03-28 NOTE — Telephone Encounter (Signed)
Lmtcb.

## 2020-03-28 NOTE — Progress Notes (Signed)
Virtual Visit via telephone Note Due to COVID-19 pandemic this visit was conducted virtually. This visit type was conducted due to national recommendations for restrictions regarding the COVID-19 Pandemic (e.g. social distancing, sheltering in place) in an effort to limit this patient's exposure and mitigate transmission in our community. All issues noted in this document were discussed and addressed.  A physical exam was not performed with this format.   I connected with [name] on 03/28/2020 at 1245 by telephone and verified that I am speaking with the correct person using two identifiers. [name] is currently located at home and family is currently with them during visit. The provider, Kari Baars, FNP is located in their office at time of visit.  I discussed the limitations, risks, security and privacy concerns of performing an evaluation and management service by telephone and the availability of in person appointments. I also discussed with the patient that there may be a patient responsible charge related to this service. The patient expressed understanding and agreed to proceed.  Subjective:  Patient ID: [name], male    DOB: 10-18-1956, 64 y.o.   MRN: [id number]  Chief Complaint:  Allergic Reaction   HPI: [name] is a 64 y.o. male presenting on 03/28/2020 for Allergic Reaction   Pt reports having an allergic reaction to Diclofenac. States this started on Friday. States he was seen in UC on Sunday. States he continues to have slight swelling to his throat. States it has improved greatly but is not completely gone. He denies voice changes, stridor, shortness of breath, lip swelling, tongue swelling, cough, choking, or trouble swallowing. He has stopped the Diclofenac and has improved greatly since stopping the medication. No chest pain, dizziness, abdominal pain, nausea, vomiting, or syncope.   Allergic Reaction This is a new problem. The current episode  started 3 to 5 days ago. The problem occurs intermittently. The problem has been rapidly improving since onset. The problem is mild. The patient was exposed to a prescription drug. The time of exposure was just prior to onset. The exposure occurred at home. Associated symptoms include globus sensation. Pertinent negatives include no abdominal pain, chest pain, chest pressure, coughing, diarrhea, difficulty breathing, drooling, eye itching, eye redness, eye watering, hyperventilation, itching, rash, stridor, trouble swallowing, vomiting or wheezing. Swelling is present on the throat (minimal on left side per pt report). Past treatments include one or more OTC medications and oral corticosteroid. The treatment provided moderate relief.     Relevant past medical, surgical, family, and social history reviewed and updated as indicated.  Allergies and medications reviewed and updated.   Past Medical History:  Diagnosis Date  . Asthma   . BPH (benign prostatic hyperplasia)   . Obesity     Past Surgical History:  Procedure Laterality Date  . CHOLECYSTECTOMY    . HERNIA REPAIR    . NASAL SINUS SURGERY    . TRIGGER FINGER RELEASE      Social History   Socioeconomic History  . Marital status: Married    Spouse name: Not on file  . Number of children: Not on file  . Years of education: Not on file  . Highest education level: Not on file  Occupational History  . Not on file  Tobacco Use  . Smoking status: Former Smoker    Packs/day: 1.00    Years: 15.00    Pack years: 15.00    Types: Cigarettes    Quit date: 12/17/2003  Years since quitting: 16.2  . Smokeless tobacco: Never Used  Substance and Sexual Activity  . Alcohol use: No  . Drug use: No  . Sexual activity: Yes  Other Topics Concern  . Not on file  Social History Narrative  . Not on file   Social Determinants of Health   Financial Resource Strain:   . Difficulty of Paying Living Expenses:   Food Insecurity:   .  Worried About Charity fundraiser in the Last Year:   . Arboriculturist in the Last Year:   Transportation Needs:   . Film/video editor (Medical):   Marland Kitchen Lack of Transportation (Non-Medical):   Physical Activity:   . Days of Exercise per Week:   . Minutes of Exercise per Session:   Stress:   . Feeling of Stress :   Social Connections:   . Frequency of Communication with Friends and Family:   . Frequency of Social Gatherings with Friends and Family:   . Attends Religious Services:   . Active Member of Clubs or Organizations:   . Attends Archivist Meetings:   Marland Kitchen Marital Status:   Intimate Partner Violence:   . Fear of Current or Ex-Partner:   . Emotionally Abused:   Marland Kitchen Physically Abused:   . Sexually Abused:     Outpatient Encounter Medications as of 03/28/2020  Medication Sig  . albuterol (VENTOLIN HFA) 108 (90 Base) MCG/ACT inhaler Inhale 2 puffs into the lungs every 6 (six) hours as needed for wheezing or shortness of breath.  . betamethasone valerate ointment (VALISONE) 0.1 % Apply 1 application topically 2 (two) times daily.  . budesonide-formoterol (SYMBICORT) 160-4.5 MCG/ACT inhaler Inhale 2 puffs into the lungs 2 (two) times daily.  . ciclopirox (LOPROX) 0.77 % cream Apply topically 2 (two) times daily. Fungal infection  . famotidine (PEPCID) 20 MG tablet Take 1 tablet (20 mg total) by mouth 2 (two) times daily.  . fluconazole (DIFLUCAN) 150 MG tablet Take 1 tablet (150 mg total) by mouth every three (3) days as needed.  . fluticasone (FLONASE) 50 MCG/ACT nasal spray Place 2 sprays into both nostrils daily.  Marland Kitchen ketoconazole (NIZORAL) 2 % cream Apply 1 application topically daily.  Marland Kitchen loratadine (CLARITIN) 10 MG tablet Take 10 mg by mouth 2 (two) times daily.  . montelukast (SINGULAIR) 10 MG tablet Take 1 tablet (10 mg total) by mouth at bedtime.  Marland Kitchen nystatin (MYCOSTATIN/NYSTOP) powder Apply topically 3 (three) times daily.  . predniSONE (DELTASONE) 20 MG tablet 2 po  at sametime daily for 5 days  . terbinafine (LAMISIL) 250 MG tablet Take 1 tablet (250 mg total) by mouth daily.  . [DISCONTINUED] diclofenac (VOLTAREN) 75 MG EC tablet Take 1 tablet (75 mg total) by mouth 2 (two) times daily.  . [DISCONTINUED] predniSONE (STERAPRED UNI-PAK 21 TAB) 10 MG (21) TBPK tablet Use as directed   No facility-administered encounter medications on file as of 03/28/2020.    No Known Allergies  Review of Systems  Constitutional: Negative for activity change, appetite change, chills, diaphoresis, fatigue, fever and unexpected weight change.  HENT: Positive for tinnitus. Negative for congestion, drooling, facial swelling, hearing loss, trouble swallowing and voice change.   Eyes: Negative.  Negative for redness and itching.  Respiratory: Negative for apnea, cough, choking, chest tightness, shortness of breath, wheezing and stridor.   Cardiovascular: Negative for chest pain, palpitations and leg swelling.  Gastrointestinal: Negative for abdominal pain, blood in stool, constipation, diarrhea, nausea and vomiting.  Endocrine: Negative.   Genitourinary: Negative for decreased urine volume, difficulty urinating, dysuria, frequency and urgency.  Musculoskeletal: Negative for arthralgias and myalgias.  Skin: Negative.  Negative for itching and rash.  Allergic/Immunologic: Negative.   Neurological: Negative for dizziness, tremors, seizures, syncope, facial asymmetry, speech difficulty, weakness, light-headedness, numbness and headaches.  Hematological: Negative.   Psychiatric/Behavioral: Negative for confusion, hallucinations, sleep disturbance and suicidal ideas.  All other systems reviewed and are negative.        Observations/Objective: No vital signs or physical exam, this was a telephone or virtual health encounter.  Pt alert and oriented, answers all questions appropriately, and able to speak in full sentences.    Assessment and Plan: [name] was seen today for  allergic reaction.  Diagnoses and all orders for this visit:  Allergic reaction, subsequent encounter Seen on UC for same on 02/24/2020. Pt still reports minimal left sided throat swelling, no other symptoms. Was given prednisone by UC. Will place on another burst of steroids along with pepcid. Pt aware of symptoms that require emergent evaluation and verbalized understanding.  -     predniSONE (DELTASONE) 20 MG tablet; 2 po at sametime daily for 5 days -     famotidine (PEPCID) 20 MG tablet; Take 1 tablet (20 mg total) by mouth 2 (two) times daily.     Follow Up Instructions: Return if symptoms worsen or fail to improve.    I discussed the assessment and treatment plan with the patient. The patient was provided an opportunity to ask questions and all were answered. The patient agreed with the plan and demonstrated an understanding of the instructions.   The patient was advised to call back or seek an in-person evaluation if the symptoms worsen or if the condition fails to improve as anticipated.  The above assessment and management plan was discussed with the patient. The patient verbalized understanding of and has agreed to the management plan. Patient is aware to call the clinic if they develop any new symptoms or if symptoms persist or worsen. Patient is aware when to return to the clinic for a follow-up visit. Patient educated on when it is appropriate to go to the emergency department.    I provided 15 minutes of non-face-to-face time during this encounter. The call started at 1245. The call ended at 1300. The other time was used for coordination of care.    Kari Baars, FNP-C [facility] [address] [place], [place] [address] [phone] 03/28/2020

## 2020-06-08 ENCOUNTER — Encounter: Payer: Self-pay | Admitting: Family Medicine

## 2020-06-08 ENCOUNTER — Ambulatory Visit (INDEPENDENT_AMBULATORY_CARE_PROVIDER_SITE_OTHER): Payer: 59 | Admitting: Family Medicine

## 2020-06-08 DIAGNOSIS — J019 Acute sinusitis, unspecified: Secondary | ICD-10-CM

## 2020-06-08 MED ORDER — AMOXICILLIN-POT CLAVULANATE 875-125 MG PO TABS: 1.0000 | ORAL_TABLET | Freq: Two times a day (BID) | ORAL | 0 refills | Status: AC

## 2020-06-08 NOTE — Progress Notes (Signed)
Virtual Visit via Telephone Note  I connected with KARLO GOEDEN on 06/08/20 at 9:33 AM by telephone and verified that I am speaking with the correct person using two identifiers. DEONDRICK SEARLS is currently located at home and his wife is currently with him during this visit. The provider, Gwenlyn Fudge, FNP is located in their office at time of visit.  I discussed the limitations, risks, security and privacy concerns of performing an evaluation and management service by telephone and the availability of in person appointments. I also discussed with the patient that there may be a patient responsible charge related to this service. The patient expressed understanding and agreed to proceed.  Subjective: PCP: Mechele Claude, MD  Chief Complaint  Patient presents with  . URI   Patient complains of cough, head congestion, sneezing, facial pain/pressure and postnasal drainage. Onset of symptoms was a few weeks ago, gradually worsening since that time. He is drinking plenty of fluids. Evaluation to date: none. Treatment to date: antihistamines and nasal steroids. He has a history of COPD with asthma. He does not smoke.    ROS: Per HPI  Current Outpatient Medications:  .  albuterol (VENTOLIN HFA) 108 (90 Base) MCG/ACT inhaler, Inhale 2 puffs into the lungs every 6 (six) hours as needed for wheezing or shortness of breath., Disp: 54 g, Rfl: 0 .  betamethasone valerate ointment (VALISONE) 0.1 %, Apply 1 application topically 2 (two) times daily., Disp: 45 g, Rfl: 5 .  budesonide-formoterol (SYMBICORT) 160-4.5 MCG/ACT inhaler, Inhale 2 puffs into the lungs 2 (two) times daily., Disp: 3 Inhaler, Rfl: 3 .  ciclopirox (LOPROX) 0.77 % cream, Apply topically 2 (two) times daily. Fungal infection, Disp: 30 g, Rfl: 2 .  famotidine (PEPCID) 20 MG tablet, Take 1 tablet (20 mg total) by mouth 2 (two) times daily., Disp: 28 tablet, Rfl: 0 .  fluticasone (FLONASE) 50 MCG/ACT nasal spray, Place 2 sprays  into both nostrils daily., Disp: 16 g, Rfl: 11 .  ketoconazole (NIZORAL) 2 % cream, Apply 1 application topically daily., Disp: 60 g, Rfl: 2 .  loratadine (CLARITIN) 10 MG tablet, Take 10 mg by mouth 2 (two) times daily., Disp: , Rfl:  .  montelukast (SINGULAIR) 10 MG tablet, Take 1 tablet (10 mg total) by mouth at bedtime., Disp: 30 tablet, Rfl: 3 .  nystatin (MYCOSTATIN/NYSTOP) powder, Apply topically 3 (three) times daily., Disp: 60 g, Rfl: 2 .  terbinafine (LAMISIL) 250 MG tablet, Take 1 tablet (250 mg total) by mouth daily., Disp: 14 tablet, Rfl: 2  Allergies  Allergen Reactions  . Diclofenac Swelling   Past Medical History:  Diagnosis Date  . Asthma   . BPH (benign prostatic hyperplasia)   . Obesity     Observations/Objective: A&O  No respiratory distress or wheezing audible over the phone Mood, judgement, and thought processes all WNL  Assessment and Plan: 1. Acute non-recurrent sinusitis, unspecified location - Discussed symptom management.  - amoxicillin-clavulanate (AUGMENTIN) 875-125 MG tablet; Take 1 tablet by mouth 2 (two) times daily for 7 days.  Dispense: 14 tablet; Refill: 0   Follow Up Instructions:  I discussed the assessment and treatment plan with the patient. The patient was provided an opportunity to ask questions and all were answered. The patient agreed with the plan and demonstrated an understanding of the instructions.   The patient was advised to call back or seek an in-person evaluation if the symptoms worsen or if the condition fails to improve as anticipated.  The above assessment and management plan was discussed with the patient. The patient verbalized understanding of and has agreed to the management plan. Patient is aware to call the clinic if symptoms persist or worsen. Patient is aware when to return to the clinic for a follow-up visit. Patient educated on when it is appropriate to go to the emergency department.   Time call ended: 9:38 AM  I  provided 7 minutes of non-face-to-face time during this encounter.  Hendricks Limes, MSN, APRN, FNP-C [facility] 06/08/20

## 2020-06-16 ENCOUNTER — Telehealth: Payer: Self-pay | Admitting: Family Medicine

## 2020-06-16 NOTE — Telephone Encounter (Signed)
Had televisit with you 06/08/20. Patient states right side of nose still stopped up and thick, green in color. No cough or fever just drainage down throat. Finished abx yesterday. Patient states it is a lot better, but thinks he needs another abx. Pharmacy is [facility] in [place].

## 2020-06-16 NOTE — Telephone Encounter (Signed)
Pt states he has finished the amoxicillin for his sinus infection, but still has an infection. He is wanting to see if something else can be sent in to [facility] in [place].

## 2020-06-19 NOTE — Telephone Encounter (Signed)
Needs appointment if still has a need.

## 2020-06-20 ENCOUNTER — Ambulatory Visit (INDEPENDENT_AMBULATORY_CARE_PROVIDER_SITE_OTHER): Payer: 59 | Admitting: Nurse Practitioner

## 2020-06-20 DIAGNOSIS — J0101 Acute recurrent maxillary sinusitis: Secondary | ICD-10-CM

## 2020-06-20 MED ORDER — CEFDINIR 300 MG PO CAPS: 300.0000 mg | ORAL_CAPSULE | Freq: Two times a day (BID) | ORAL | 0 refills | Status: DC

## 2020-06-20 NOTE — Telephone Encounter (Signed)
Pt scheduled virtual visit in after hours today.

## 2020-06-20 NOTE — Progress Notes (Signed)
Virtual Visit via telephone Note Due to COVID-19 pandemic this visit was conducted virtually. This visit type was conducted due to national recommendations for restrictions regarding the COVID-19 Pandemic (e.g. social distancing, sheltering in place) in an effort to limit this patient's exposure and mitigate transmission in our community. All issues noted in this document were discussed and addressed.  A physical exam was not performed with this format.  I connected with [name] on 06/20/20 at 9:40 by telephone and verified that I am speaking with the correct person using two identifiers. [name] is currently located at work and no one is currently with  him during visit. The provider, Mary-Margaret Daphine Deutscher, FNP is located in their office at time of visit.  I discussed the limitations, risks, security and privacy concerns of performing an evaluation and management service by telephone and the availability of in person appointments. I also discussed with the patient that there may be a patient responsible charge related to this service. The patient expressed understanding and agreed to proceed.   History and Present Illness:   Chief Complaint: URI   HPI Patient calls in stating that he has had pressure in right side of face for over a month. He has been on amoxicillin about 1 1/2 weeks ago. Is some better but not completely resolved. Still having facial pressure and congestion. Has had polyps removed from sinuses in the past   Review of Systems  Constitutional: Negative for chills and fever.  HENT: Positive for congestion and sinus pain. Negative for ear pain and sore throat.   Respiratory: Negative for cough.   Neurological: Positive for headaches.  Psychiatric/Behavioral: Negative.   All other systems reviewed and are negative.    Observations/Objective: Alert and oriented- answers all questions appropriately No distress Voice sounds nasal  Assessment and  Plan: 1. Take meds as prescribed 2. Use a cool mist humidifier especially during the winter months and when heat has been humid. 3. Use saline nose sprays frequently 4. Saline irrigations of the nose can be very helpful if done frequently.  * 4X daily for 1 week*  * Use of a nettie pot can be helpful with this. Follow directions with this* 5. Drink plenty of fluids 6. Keep thermostat turn down low 7.continue flonase and allergy meds 8. For fever or aces or pains- take tylenol or ibuprofen appropriate for age and weight.  * for fevers greater than 101 orally you may alternate ibuprofen and tylenol every  3 hours.   Meds ordered this encounter  Medications  . cefdinir (OMNICEF) 300 MG capsule    Sig: Take 1 capsule (300 mg total) by mouth 2 (two) times daily. 1 po BID    Dispense:  20 capsule    Refill:  0    Order Specific Question:   Supervising Provider    Answer:   Arville Care A [[id number]]   * if antibiotic doe snot help will need  To go back to ENT  Follow Up Instructions: prn    I discussed the assessment and treatment plan with the patient. The patient was provided an opportunity to ask questions and all were answered. The patient agreed with the plan and demonstrated an understanding of the instructions.   The patient was advised to call back or seek an in-person evaluation if the symptoms worsen or if the condition fails to improve as anticipated.  The above assessment and management plan was discussed with the patient. The patient verbalized understanding  of and has agreed to the management plan. Patient is aware to call the clinic if symptoms persist or worsen. Patient is aware when to return to the clinic for a follow-up visit. Patient educated on when it is appropriate to go to the emergency department.   Time call ended:  9:55  I provided 15 minutes of non-face-to-face time during this encounter.    Mary-Margaret Daphine Deutscher, FNP

## 2020-07-24 ENCOUNTER — Ambulatory Visit (INDEPENDENT_AMBULATORY_CARE_PROVIDER_SITE_OTHER): Payer: 59 | Admitting: Family

## 2020-07-24 ENCOUNTER — Encounter: Payer: Self-pay | Admitting: Family

## 2020-07-24 ENCOUNTER — Other Ambulatory Visit: Payer: Self-pay

## 2020-07-24 DIAGNOSIS — J42 Unspecified chronic bronchitis: Secondary | ICD-10-CM

## 2020-07-24 DIAGNOSIS — Z7189 Other specified counseling: Secondary | ICD-10-CM

## 2020-07-24 MED ORDER — PREDNISONE 10 MG (21) PO TBPK: ORAL_TABLET | ORAL | 0 refills | Status: DC

## 2020-07-24 NOTE — Progress Notes (Signed)
Virtual Visit via telephone Note Due to COVID-19 pandemic this visit was conducted virtually. This visit type was conducted due to national recommendations for restrictions regarding the COVID-19 Pandemic (e.g. social distancing, sheltering in place) in an effort to limit this patient's exposure and mitigate transmission in our community. All issues noted in this document were discussed and addressed.  A physical exam was not performed with this format.  I connected with [name] on 07/24/20 at 2:52 pm  by telephone and verified that I am speaking with the correct person using two identifiers. [name] is currently located at home and wife is currently with him during visit. The provider, Jannifer Rodney, FNP is located in their office at time of visit.  I discussed the limitations, risks, security and privacy concerns of performing an evaluation and management service by telephone and the availability of in person appointments. I also discussed with the patient that there may be a patient responsible charge related to this service. The patient expressed understanding and agreed to proceed.   History and Present Illness:  Pt calls the office today with sinus pressure, fever, chills that started two days ago. [facility] that is not back yet. He has not been vaccinated.  Sinusitis This is a new problem. The current episode started in the past 7 days. The problem has been gradually worsening since onset. The maximum temperature recorded prior to his arrival was 100.4 - 100.9 F. His pain is at a severity of 7/10. The pain is moderate. Associated symptoms include chills, congestion, coughing (productive clear), headaches, a hoarse voice, shortness of breath (yes when coughing) and sinus pressure. Pertinent negatives include no ear pain, neck pain, sneezing or sore throat. Past treatments include oral decongestants. The treatment provided mild relief.      Review  of Systems  Constitutional: Positive for chills.  HENT: Positive for congestion, hoarse voice and sinus pressure. Negative for ear pain, sneezing and sore throat.   Respiratory: Positive for cough (productive clear) and shortness of breath (yes when coughing).   Musculoskeletal: Negative for neck pain.  Neurological: Positive for headaches.  All other systems reviewed and are negative.    Observations/Objective: No SOB or distress noted   Assessment and Plan: 1. Chronic bronchitis, unspecified chronic bronchitis type (HCC) COVID pending - Take meds as prescribed - Use a cool mist humidifier  -Use saline nose sprays frequently -Force fluids -For any cough or congestion  Use plain Mucinex- regular strength or max strength is fine -For fever or aces or pains- take tylenol or ibuprofen. -Discussed COVID vaccine - predniSONE (STERAPRED UNI-PAK 21 TAB) 10 MG (21) TBPK tablet; Use as directed  Dispense: 21 tablet; Refill: 0     I discussed the assessment and treatment plan with the patient. The patient was provided an opportunity to ask questions and all were answered. The patient agreed with the plan and demonstrated an understanding of the instructions.   The patient was advised to call back or seek an in-person evaluation if the symptoms worsen or if the condition fails to improve as anticipated.  The above assessment and management plan was discussed with the patient. The patient verbalized understanding of and has agreed to the management plan. Patient is aware to call the clinic if symptoms persist or worsen. Patient is aware when to return to the clinic for a follow-up visit. Patient educated on when it is appropriate to go to the emergency  department.   Time call ended:  3:04 pm   I provided 12 minutes of non-face-to-face time during this encounter.    Jannifer Rodney, FNP

## 2020-11-24 ENCOUNTER — Encounter: Payer: Self-pay | Admitting: Family Medicine

## 2020-11-24 ENCOUNTER — Ambulatory Visit (INDEPENDENT_AMBULATORY_CARE_PROVIDER_SITE_OTHER): Payer: 59 | Admitting: Family Medicine

## 2020-11-24 DIAGNOSIS — J4540 Moderate persistent asthma, uncomplicated: Secondary | ICD-10-CM

## 2020-11-24 DIAGNOSIS — J0101 Acute recurrent maxillary sinusitis: Secondary | ICD-10-CM

## 2020-11-24 DIAGNOSIS — T7840XA Allergy, unspecified, initial encounter: Secondary | ICD-10-CM

## 2020-11-24 MED ORDER — AMOXICILLIN-POT CLAVULANATE 875-125 MG PO TABS: 1.0000 | ORAL_TABLET | Freq: Two times a day (BID) | ORAL | 0 refills | Status: DC

## 2020-11-24 MED ORDER — PREDNISONE 20 MG PO TABS: ORAL_TABLET | ORAL | 0 refills | Status: DC

## 2020-11-24 MED ORDER — FLUTICASONE PROPIONATE 50 MCG/ACT NA SUSP: 2.0000 | Freq: Every day | NASAL | 11 refills | Status: DC

## 2020-11-24 MED ORDER — BUDESONIDE-FORMOTEROL FUMARATE 160-4.5 MCG/ACT IN AERO: 2.0000 | INHALATION_SPRAY | Freq: Two times a day (BID) | RESPIRATORY_TRACT | 3 refills | Status: DC

## 2020-11-24 NOTE — Progress Notes (Signed)
Virtual Visit via telephone Note  I connected with [name] on 11/24/20 at 1423 by telephone and verified that I am speaking with the correct person using two identifiers. [name] is currently located at home and patient are currently with her during visit. The provider, Elige Radon Cheri Ayotte, MD is located in their office at time of visit.  Call ended at 1429  I discussed the limitations, risks, security and privacy concerns of performing an evaluation and management service by telephone and the availability of in person appointments. I also discussed with the patient that there may be a patient responsible charge related to this service. The patient expressed understanding and agreed to proceed.   History and Present Illness: Patient is calling in for sinus pressure and congestion.  He started 1 week ago with sinus issues.  He denies fevers or chills or body aches. He is having to use a lot of drainage.  He is taking allergy medicine and is not changed from those. He is not having any shortness of breath or wheezing. He denies sick contacts.   1. Acute recurrent maxillary sinusitis   2. Allergic reaction, initial encounter   3. Moderate persistent asthma, unspecified whether complicated     Outpatient Encounter Medications as of 11/24/2020  Medication Sig  . albuterol (VENTOLIN HFA) 108 (90 Base) MCG/ACT inhaler Inhale 2 puffs into the lungs every 6 (six) hours as needed for wheezing or shortness of breath.  Marland Kitchen amoxicillin-clavulanate (AUGMENTIN) 875-125 MG tablet Take 1 tablet by mouth 2 (two) times daily.  . betamethasone valerate ointment (VALISONE) 0.1 % Apply 1 application topically 2 (two) times daily.  . budesonide-formoterol (SYMBICORT) 160-4.5 MCG/ACT inhaler Inhale 2 puffs into the lungs 2 (two) times daily.  . ciclopirox (LOPROX) 0.77 % cream Apply topically 2 (two) times daily. Fungal infection  . famotidine (PEPCID) 20 MG tablet Take 1 tablet (20 mg total) by  mouth 2 (two) times daily.  . fluticasone (FLONASE) 50 MCG/ACT nasal spray Place 2 sprays into both nostrils daily.  Marland Kitchen ketoconazole (NIZORAL) 2 % cream Apply 1 application topically daily.  Marland Kitchen loratadine (CLARITIN) 10 MG tablet Take 10 mg by mouth 2 (two) times daily.  . montelukast (SINGULAIR) 10 MG tablet Take 1 tablet (10 mg total) by mouth at bedtime.  Marland Kitchen nystatin (MYCOSTATIN/NYSTOP) powder Apply topically 3 (three) times daily.  . predniSONE (DELTASONE) 20 MG tablet 2 po at same time daily for 5 days  . terbinafine (LAMISIL) 250 MG tablet Take 1 tablet (250 mg total) by mouth daily.  . [DISCONTINUED] budesonide-formoterol (SYMBICORT) 160-4.5 MCG/ACT inhaler Inhale 2 puffs into the lungs 2 (two) times daily.  . [DISCONTINUED] fluticasone (FLONASE) 50 MCG/ACT nasal spray Place 2 sprays into both nostrils daily.  . [DISCONTINUED] predniSONE (STERAPRED UNI-PAK 21 TAB) 10 MG (21) TBPK tablet Use as directed   No facility-administered encounter medications on file as of 11/24/2020.    Review of Systems  Constitutional: Negative for chills and fever.  HENT: Positive for congestion, postnasal drip, rhinorrhea, sinus pressure and sneezing. Negative for ear discharge, ear pain, sore throat and voice change.   Eyes: Negative for pain, discharge, redness and visual disturbance.  Respiratory: Positive for cough. Negative for shortness of breath and wheezing.   Cardiovascular: Negative for chest pain and leg swelling.  Musculoskeletal: Negative for gait problem.  Skin: Negative for rash.  All other systems reviewed and are negative.   Observations/Objective: Patient sounds comfortable and in no acute distress  Assessment and Plan: Problem List Items Addressed This Visit   None   Visit Diagnoses    Acute recurrent maxillary sinusitis    -  Primary   Relevant Medications   amoxicillin-clavulanate (AUGMENTIN) 875-125 MG tablet   predniSONE (DELTASONE) 20 MG tablet   fluticasone (FLONASE) 50  MCG/ACT nasal spray   Allergic reaction, initial encounter       Relevant Medications   fluticasone (FLONASE) 50 MCG/ACT nasal spray   Moderate persistent asthma, unspecified whether complicated       Relevant Medications   predniSONE (DELTASONE) 20 MG tablet   budesonide-formoterol (SYMBICORT) 160-4.5 MCG/ACT inhaler      Patient wants refill on Symbicort, it does not sound like he is having any asthma symptoms or trouble breathing, sounds like rhinitis, will refill his medicines. Follow up plan: Return if symptoms worsen or fail to improve.     I discussed the assessment and treatment plan with the patient. The patient was provided an opportunity to ask questions and all were answered. The patient agreed with the plan and demonstrated an understanding of the instructions.   The patient was advised to call back or seek an in-person evaluation if the symptoms worsen or if the condition fails to improve as anticipated.  The above assessment and management plan was discussed with the patient. The patient verbalized understanding of and has agreed to the management plan. Patient is aware to call the clinic if symptoms persist or worsen. Patient is aware when to return to the clinic for a follow-up visit. Patient educated on when it is appropriate to go to the emergency department.    I provided 6 minutes of non-face-to-face time during this encounter.    Nils Pyle, MD

## 2020-12-22 ENCOUNTER — Ambulatory Visit (INDEPENDENT_AMBULATORY_CARE_PROVIDER_SITE_OTHER): Payer: 59 | Admitting: Nurse Practitioner

## 2020-12-22 ENCOUNTER — Encounter: Payer: Self-pay | Admitting: Nurse Practitioner

## 2020-12-22 ENCOUNTER — Other Ambulatory Visit: Payer: Self-pay

## 2020-12-22 VITALS — BP 141/85 | HR 81 | Ht 72.0 in | Wt 287.0 lb

## 2020-12-22 DIAGNOSIS — L309 Dermatitis, unspecified: Secondary | ICD-10-CM | POA: Diagnosis not present

## 2020-12-22 NOTE — Progress Notes (Addendum)
Established Patient Office Visit  Subjective:  Patient ID: [name], male    DOB: 04-26-1956  Age: 65 y.o. MRN: [id number]  CC:  Chief Complaint  Patient presents with  . Rash    Abdominal skin fold and chest    HPI [name] is a 65 year old male who presents to clinic with eczema.  Eczema is not new but a chronic problem for patient.  Patient has tried different topical treatments like Nizoral 2%, Loprox 0.77% but has had no therapeutic effect.  Patient reports that eczema bleeds sometimes after scratching.  Patient reports hot water makes skin feel better.  Patient reports skin irritation wakes him up sometimes from sleep.  Patient is reporting new rash on abdomen and skin fold, chest with redness and increased itching.  Patient is not reporting any fever, chills or shortness of breath.  Past Medical History:  Diagnosis Date  . Asthma   . BPH (benign prostatic hyperplasia)   . Obesity     Past Surgical History:  Procedure Laterality Date  . CHOLECYSTECTOMY    . HERNIA REPAIR    . NASAL SINUS SURGERY    . TRIGGER FINGER RELEASE      Family History  Problem Relation Age of Onset  . Heart disease Father   . Liver cancer Brother   . Asthma Son     Social History   Socioeconomic History  . Marital status: Married    Spouse name: Not on file  . Number of children: Not on file  . Years of education: Not on file  . Highest education level: Not on file  Occupational History  . Not on file  Tobacco Use  . Smoking status: Former Smoker    Packs/day: 1.00    Years: 15.00    Pack years: 15.00    Types: Cigarettes    Quit date: 12/17/2003    Years since quitting: 17.0  . Smokeless tobacco: Never Used  Vaping Use  . Vaping Use: Never used  Substance and Sexual Activity  . Alcohol use: No  . Drug use: No  . Sexual activity: Yes  Other Topics Concern  . Not on file  Social History Narrative  . Not on file   Social Determinants of Health   Financial  Resource Strain: Not on file  Food Insecurity: Not on file  Transportation Needs: Not on file  Physical Activity: Not on file  Stress: Not on file  Social Connections: Not on file  Intimate Partner Violence: Not on file    Outpatient Medications Prior to Visit  Medication Sig Dispense Refill  . albuterol (VENTOLIN HFA) 108 (90 Base) MCG/ACT inhaler Inhale 2 puffs into the lungs every 6 (six) hours as needed for wheezing or shortness of breath. 54 g 0  . amoxicillin-clavulanate (AUGMENTIN) 875-125 MG tablet Take 1 tablet by mouth 2 (two) times daily. 20 tablet 0  . betamethasone valerate ointment (VALISONE) 0.1 % Apply 1 application topically 2 (two) times daily. 45 g 5  . budesonide-formoterol (SYMBICORT) 160-4.5 MCG/ACT inhaler Inhale 2 puffs into the lungs 2 (two) times daily. 3 each 3  . ciclopirox (LOPROX) 0.77 % cream Apply topically 2 (two) times daily. Fungal infection 30 g 2  . famotidine (PEPCID) 20 MG tablet Take 1 tablet (20 mg total) by mouth 2 (two) times daily. 28 tablet 0  . fluticasone (FLONASE) 50 MCG/ACT nasal spray Place 2 sprays into both nostrils daily. 16 g 11  . ketoconazole (NIZORAL) 2 %  cream Apply 1 application topically daily. 60 g 2  . loratadine (CLARITIN) 10 MG tablet Take 10 mg by mouth 2 (two) times daily.    . montelukast (SINGULAIR) 10 MG tablet Take 1 tablet (10 mg total) by mouth at bedtime. 30 tablet 3  . nystatin (MYCOSTATIN/NYSTOP) powder Apply topically 3 (three) times daily. 60 g 2  . predniSONE (DELTASONE) 20 MG tablet 2 po at same time daily for 5 days 10 tablet 0  . terbinafine (LAMISIL) 250 MG tablet Take 1 tablet (250 mg total) by mouth daily. 14 tablet 2   No facility-administered medications prior to visit.    Allergies  Allergen Reactions  . Diclofenac Swelling    ROS Review of Systems  Respiratory: Negative for cough and shortness of breath.   Skin: Positive for color change and rash.  All other systems reviewed and are  negative.     Objective:    Physical Exam Vitals reviewed. Exam conducted with a chaperone present (Spouse).  Constitutional:      General: He is awake.     Appearance: He is well-groomed. He is obese.     Interventions: Face mask in place.  HENT:     Head: Normocephalic.     Nose: Nose normal.  Eyes:     Conjunctiva/sclera: Conjunctivae normal.  Cardiovascular:     Rate and Rhythm: Normal rate and regular rhythm.     Pulses: Normal pulses.     Heart sounds: Normal heart sounds.  Abdominal:     General: Bowel sounds are normal.  Musculoskeletal:        General: Normal range of motion.  Skin:    General: Skin is dry.     Findings: Erythema and rash present.  Neurological:     Mental Status: He is alert.  Psychiatric:        Mood and Affect: Mood normal.        Behavior: Behavior normal. Behavior is cooperative.     BP (!) 141/85   Pulse 81   Ht 6' (1.829 m)   Wt 287 lb (130.2 kg)   SpO2 95%   BMI 38.92 kg/m  Wt Readings from Last 3 Encounters:  12/22/20 287 lb (130.2 kg)  03/31/18 276 lb 3.2 oz (125.3 kg)  01/30/17 282 lb (127.9 kg)     Health Maintenance Due  Topic Date Due  . Hepatitis C Screening  Never done  . COVID-19 Vaccine (1) Never done  . HIV Screening  Never done  . COLONOSCOPY (Pts 45-22yrs Insurance coverage will need to be confirmed)  Never done    Lab Results  Component Value Date   WBC 14.1 (H) 10/06/2007   HGB 13.9 10/06/2007   HCT 40.7 10/06/2007   MCV 92.5 10/06/2007   PLT 190 10/06/2007   Lab Results  Component Value Date   NA 136 01/30/2017   K 3.9 01/30/2017   CO2 25 01/30/2017   GLUCOSE 77 01/30/2017   BUN 5 (L) 01/30/2017   CREATININE 1.22 01/30/2017   BILITOT 0.6 01/30/2017   ALKPHOS 94 01/30/2017   AST 29 01/30/2017   ALT 31 01/30/2017   PROT 7.3 01/30/2017   ALBUMIN 4.1 01/30/2017   CALCIUM 9.0 01/30/2017   Lab Results  Component Value Date   CHOL 138 01/30/2017   Lab Results  Component Value Date   HDL  32 (L) 01/30/2017   Lab Results  Component Value Date   LDLCALC 65 01/30/2017   Lab Results  Component Value Date   TRIG 205 (H) 01/30/2017   Lab Results  Component Value Date   CHOLHDL 4.3 01/30/2017   Lab Results  Component Value Date   HGBA1C 5.7 01/30/2017      Assessment & Plan:   Problem List Items Addressed This Visit      Musculoskeletal and Integument   Eczema - Primary    Eczema not well controlled on current medication.  Referral to dermatology completed.  Follow-up with worsening unresolved symptoms.      Relevant Orders   Ambulatory referral to Dermatology      No orders of the defined types were placed in this encounter.   Follow-up: Return if symptoms worsen or fail to improve.    Ivy Lynn, NP

## 2020-12-22 NOTE — Patient Instructions (Addendum)
Referral to dermatology completed for chronic eczema.   Eczema Eczema is a broad term for a group of skin conditions that cause skin to become rough and inflamed. Each type of eczema has different triggers, symptoms, and treatments. Eczema of any type is usually itchy and symptoms range from mild to severe. Eczema and its symptoms are not spread from person to person (are not contagious). It can appear on different parts of the body at different times. Your eczema may not look the same as someone else's eczema. What are the types of eczema? Atopic dermatitis This is a long-term (chronic) skin disease that keeps coming back (recurring). Usual symptoms are dry skin and small, solid pimples that may swell and leak fluid (weep). Contact dermatitis  This happens when something irritates the skin and causes a rash. The irritation can come from substances that you are allergic to (allergens), such as poison ivy, chemicals, or medicines that were applied to your skin. Dyshidrotic eczema This is a form of eczema on the hands and feet. It shows up as very itchy, fluid-filled blisters. It can affect people of any age, but is more common before age 65. Hand eczema  This causes very itchy areas of skin on the palms and sides of the hands and fingers. This type of eczema is common in industrial jobs where you may be exposed to many different types of irritants. Lichen simplex chronicus This type of eczema occurs when a person constantly scratches one area of the body. Repeated scratching of the area leads to thickened skin (lichenification). Lichen simplex chronicus can occur along with other types of eczema. It is more common in adults, but may be seen in children as well. Nummular eczema This is a common type of eczema. It has no known cause. It typically causes a red, circular, crusty lesion (plaque) that may be itchy. Scratching may become a habit and can cause bleeding. Nummular eczema occurs most often  in people of middle-age or older. It most often affects the hands. Seborrheic dermatitis This is a common skin disease that mainly affects the scalp. It may also affect any oily areas of the body, such as the face, sides of nose, eyebrows, ears, eyelids, and chest. It is marked by small scaling and redness of the skin (erythema). This can affect people of all ages. In infants, this condition is known as Location manager." Stasis dermatitis This is a common skin disease that usually appears on the legs and feet. It most often occurs in people who have a condition that prevents blood from being pumped through the veins in the legs (chronic venous insufficiency). Stasis dermatitis is a chronic condition that needs long-term management. How is eczema diagnosed? Your health care provider will examine your skin and review your medical history. He or she may also give you skin patch tests. These tests involve taking patches that contain possible allergens and placing them on your back. He or she will then check in a few days to see if an allergic reaction occurred. What are the common treatments? Treatment for eczema is based on the type of eczema you have. Hydrocortisone steroid medicine can relieve itching quickly and help reduce inflammation. This medicine may be prescribed or obtained over-the-counter, depending on the strength of the medicine that is needed. Follow these instructions at home:  Take over-the-counter and prescription medicines only as told by your health care provider.  Use creams or ointments to moisturize your skin. Do not use lotions.  Learn  what triggers or irritates your symptoms. Avoid these things.  Treat symptom flare-ups quickly.  Do not itch your skin. This can make your rash worse.  Keep all follow-up visits as told by your health care provider. This is important. Where to find more information  The American Academy of Dermatology: InfoExam.si  The National Eczema  Association: www.nationaleczema.org Contact a health care provider if:  You have serious itching, even with treatment.  You regularly scratch your skin until it bleeds.  Your rash looks different than usual.  Your skin is painful, swollen, or more red than usual.  You have a fever. Summary  There are eight general types of eczema. Each type has different triggers.  Eczema of any type causes itching that may range from mild to severe.  Treatment varies based on the type of eczema you have. Hydrocortisone steroid medicine can help with itching and inflammation.  Protecting your skin is the best way to prevent eczema. Use moisturizers and lotions. Avoid triggers and irritants, and treat flare-ups quickly. This information is not intended to replace advice given to you by your health care provider. Make sure you discuss any questions you have with your health care provider. Document Revised: 11/14/2017 Document Reviewed: 04/17/2017 Elsevier Patient Education  2020 [employer].

## 2020-12-22 NOTE — Assessment & Plan Note (Signed)
Eczema not well controlled on current medication.  Referral to dermatology completed.  Follow-up with worsening unresolved symptoms.

## 2021-06-29 ENCOUNTER — Encounter: Payer: Self-pay | Admitting: Family Medicine

## 2021-06-29 ENCOUNTER — Ambulatory Visit (INDEPENDENT_AMBULATORY_CARE_PROVIDER_SITE_OTHER): Payer: 59 | Admitting: Family Medicine

## 2021-06-29 DIAGNOSIS — J0101 Acute recurrent maxillary sinusitis: Secondary | ICD-10-CM

## 2021-06-29 MED ORDER — PREDNISONE 20 MG PO TABS: ORAL_TABLET | ORAL | 0 refills | Status: DC

## 2021-06-29 MED ORDER — AMOXICILLIN-POT CLAVULANATE 875-125 MG PO TABS: 1.0000 | ORAL_TABLET | Freq: Two times a day (BID) | ORAL | 0 refills | Status: DC

## 2021-06-29 NOTE — Progress Notes (Signed)
   Virtual Visit  Note Due to COVID-19 pandemic this visit was conducted virtually. This visit type was conducted due to national recommendations for restrictions regarding the COVID-19 Pandemic (e.g. social distancing, sheltering in place) in an effort to limit this patient's exposure and mitigate transmission in our community. All issues noted in this document were discussed and addressed.  A physical exam was not performed with this format.  I connected with [name] on 06/29/21 at 0958 by telephone and verified that I am speaking with the correct person using two identifiers. [name] is currently located at home and his family is currently with him during the visit. The provider, Gabriel Earing, FNP is located in their office at time of visit.  I discussed the limitations, risks, security and privacy concerns of performing an evaluation and management service by telephone and the availability of in person appointments. I also discussed with the patient that there may be a patient responsible charge related to this service. The patient expressed understanding and agreed to proceed.  CC: sinusitis  History and Present Illness:  HPI [name] reports head congestion, postnasal drip, and cough for 2-3 weeks. He also feels tired. He denies fever, body aches, chills, chest pain, or shortness of breath. He has tried flonase, mucinex, benadryl, and claritin without much improvement. There is a history of recurrent sinus infections requiring antibiotics.     ROS As per HPI.   Observations/Objective: Alert and oriented x 3. Able to speak in full sentences without difficulty.    Assessment and Plan: Diagnoses and all orders for this visit:  Acute recurrent maxillary sinusitis Continue allergy medications, mucinex, nasal saline for symptoms. Augmentin and prednisone as below. Stay well hydrated, rest.  -     amoxicillin-clavulanate (AUGMENTIN) 875-125 MG tablet; Take 1 tablet by  mouth 2 (two) times daily. -     predniSONE (DELTASONE) 20 MG tablet; 2 po at same time daily for 5 days    Follow Up Instructions: Return to office for new or worsening symptoms, or if symptoms persist.     I discussed the assessment and treatment plan with the patient. The patient was provided an opportunity to ask questions and all were answered. The patient agreed with the plan and demonstrated an understanding of the instructions.   The patient was advised to call back or seek an in-person evaluation if the symptoms worsen or if the condition fails to improve as anticipated.  The above assessment and management plan was discussed with the patient. The patient verbalized understanding of and has agreed to the management plan. Patient is aware to call the clinic if symptoms persist or worsen. Patient is aware when to return to the clinic for a follow-up visit. Patient educated on when it is appropriate to go to the emergency department.   Time call ended:  1011  I provided 13 minutes of  non face-to-face time during this encounter.    Gabriel Earing, FNP

## 2021-11-12 ENCOUNTER — Ambulatory Visit (INDEPENDENT_AMBULATORY_CARE_PROVIDER_SITE_OTHER): Payer: BC Managed Care – PPO | Admitting: Family

## 2021-11-12 ENCOUNTER — Encounter: Payer: Self-pay | Admitting: Family

## 2021-11-12 DIAGNOSIS — J441 Chronic obstructive pulmonary disease with (acute) exacerbation: Secondary | ICD-10-CM | POA: Diagnosis not present

## 2021-11-12 MED ORDER — DOXYCYCLINE HYCLATE 100 MG PO TABS: 100.0000 mg | ORAL_TABLET | Freq: Two times a day (BID) | ORAL | 0 refills | Status: DC

## 2021-11-12 MED ORDER — ALBUTEROL SULFATE HFA 108 (90 BASE) MCG/ACT IN AERS: 2.0000 | INHALATION_SPRAY | Freq: Four times a day (QID) | RESPIRATORY_TRACT | 0 refills | Status: DC | PRN

## 2021-11-12 MED ORDER — PREDNISONE 10 MG (21) PO TBPK: ORAL_TABLET | ORAL | 0 refills | Status: DC

## 2021-11-12 NOTE — Progress Notes (Signed)
Virtual Visit  Note Due to COVID-19 pandemic this visit was conducted virtually. This visit type was conducted due to national recommendations for restrictions regarding the COVID-19 Pandemic (e.g. social distancing, sheltering in place) in an effort to limit this patient's exposure and mitigate transmission in our community. All issues noted in this document were discussed and addressed.  A physical exam was not performed with this format.  I connected with [name] on 11/12/21 at 1:14 pm  by telephone and verified that I am speaking with the correct person using two identifiers. [name] is currently located at home and no one is currently with him  during visit. The provider, Jannifer Rodney, FNP is located in their office at time of visit.  I discussed the limitations, risks, security and privacy concerns of performing an evaluation and management service by telephone and the availability of in person appointments. I also discussed with the patient that there may be a patient responsible charge related to this service. The patient expressed understanding and agreed to proceed.  [name], [name] are scheduled for a virtual visit with your provider today.    Just as we do with appointments in the office, we must obtain your consent to participate.  Your consent will be active for this visit and any virtual visit you may have with one of our providers in the next 365 days.    If you have a MyChart account, I can also send a copy of this consent to you electronically.  All virtual visits are billed to your insurance company just like a traditional visit in the office.  As this is a virtual visit, video technology does not allow for your provider to perform a traditional examination.  This may limit your provider's ability to fully assess your condition.  If your provider identifies any concerns that need to be evaluated in person or the need to arrange testing such as labs, EKG, etc, we will  make arrangements to do so.    Although advances in technology are sophisticated, we cannot ensure that it will always work on either your end or our end.  If the connection with a video visit is poor, we may have to switch to a telephone visit.  With either a video or telephone visit, we are not always able to ensure that we have a secure connection.   I need to obtain your verbal consent now.   Are you willing to proceed with your visit today?   [name] has provided verbal consent on 11/12/2021 for a virtual visit (video or telephone).   Jannifer Rodney, [place] 11/12/2021  1:16 PM    History and Present Illness:  Cough This is a new problem. The current episode started in the past 7 days. The problem has been gradually worsening. The problem occurs every few minutes. The cough is Productive of sputum. Associated symptoms include nasal congestion, postnasal drip, shortness of breath and wheezing. Pertinent negatives include no chills, ear congestion, ear pain, fever or headaches. The symptoms are aggravated by lying down. He has tried rest and OTC cough suppressant (Symbicort) for the symptoms. The treatment provided mild relief.     Review of Systems  Constitutional:  Negative for chills and fever.  HENT:  Positive for postnasal drip. Negative for ear pain.   Respiratory:  Positive for cough, shortness of breath and wheezing.   Neurological:  Negative for headaches.    Observations/Objective: No SOB or distress noted, intermittent cough  Assessment and Plan: 1. COPD exacerbation (HCC) - Pt will take home COVID test to rule out, if positive he will call office and let us know. I will send antivirals.  - Take meds as prescribed - Use a cool mist humidifier  -Use saline nose sprays frequently -Force fluids -For any cough or congestion  Use plain Mucinex- regular strength or max strength is fine -For fever or aces or pains- take tylenol or ibuprofen. -Throat lozenges if  help -Call if symptoms worsen or do not improve - doxycycline (VIBRA-TABS) 100 MG tablet; Take 1 tablet (100 mg total) by mouth 2 (two) times daily.  Dispense: 20 tablet; Refill: 0 - predniSONE (STERAPRED UNI-PAK 21 TAB) 10 MG (21) TBPK tablet; Use as directed  Dispense: 21 tablet; Refill: 0 - albuterol (VENTOLIN HFA) 108 (90 Base) MCG/ACT inhaler; Inhale 2 puffs into the lungs every 6 (six) hours as needed for wheezing or shortness of breath.  Dispense: 54 g; Refill: 0        I discussed the assessment and treatment plan with the patient. The patient was provided an opportunity to ask questions and all were answered. The patient agreed with the plan and demonstrated an understanding of the instructions.   The patient was advised to call back or seek an in-person evaluation if the symptoms worsen or if the condition fails to improve as anticipated.  The above assessment and management plan was discussed with the patient. The patient verbalized understanding of and has agreed to the management plan. Patient is aware to call the clinic if symptoms persist or worsen. Patient is aware when to return to the clinic for a follow-up visit. Patient educated on when it is appropriate to go to the emergency department.   Time call ended:  1:25 pm   I provided 11 minutes of  non face-to-face time during this encounter.    Jannifer Rodney, FNP

## 2021-12-12 ENCOUNTER — Other Ambulatory Visit: Payer: Self-pay | Admitting: Family Medicine

## 2021-12-12 DIAGNOSIS — J4540 Moderate persistent asthma, uncomplicated: Secondary | ICD-10-CM

## 2022-01-01 DIAGNOSIS — B351 Tinea unguium: Secondary | ICD-10-CM | POA: Diagnosis not present

## 2022-01-02 DIAGNOSIS — L821 Other seborrheic keratosis: Secondary | ICD-10-CM | POA: Diagnosis not present

## 2022-01-02 DIAGNOSIS — L900 Lichen sclerosus et atrophicus: Secondary | ICD-10-CM | POA: Diagnosis not present

## 2022-01-02 DIAGNOSIS — L810 Postinflammatory hyperpigmentation: Secondary | ICD-10-CM | POA: Diagnosis not present

## 2022-08-26 ENCOUNTER — Telehealth (INDEPENDENT_AMBULATORY_CARE_PROVIDER_SITE_OTHER): Payer: BC Managed Care – PPO | Admitting: Nurse Practitioner

## 2022-08-26 ENCOUNTER — Encounter: Payer: Self-pay | Admitting: Nurse Practitioner

## 2022-08-26 DIAGNOSIS — R051 Acute cough: Secondary | ICD-10-CM

## 2022-08-26 DIAGNOSIS — R509 Fever, unspecified: Secondary | ICD-10-CM | POA: Diagnosis not present

## 2022-08-26 MED ORDER — DM-GUAIFENESIN ER 30-600 MG PO TB12: 1.0000 | ORAL_TABLET | Freq: Two times a day (BID) | ORAL | 0 refills | Status: DC

## 2022-08-26 MED ORDER — ACETAMINOPHEN 500 MG PO TABS: 500.0000 mg | ORAL_TABLET | Freq: Four times a day (QID) | ORAL | 0 refills | Status: DC | PRN

## 2022-08-26 NOTE — Progress Notes (Signed)
   Virtual Visit  Note Due to COVID-19 pandemic this visit was conducted virtually. This visit type was conducted due to national recommendations for restrictions regarding the COVID-19 Pandemic (e.g. social distancing, sheltering in place) in an effort to limit this patient's exposure and mitigate transmission in our community. All issues noted in this document were discussed and addressed.  A physical exam was not performed with this format.  I connected with [name] on 08/26/22 at 4:30 pm  by telephone and verified that I am speaking with the correct person using two identifiers. [name] is currently located at home during visit. The provider, Daryll Drown, NP is located in their office at time of visit.  I discussed the limitations, risks, security and privacy concerns of performing an evaluation and management service by telephone and the availability of in person appointments. I also discussed with the patient that there may be a patient responsible charge related to this service. The patient expressed understanding and agreed to proceed.   History and Present Illness:  Fever  This is a new problem. The current episode started yesterday. The problem occurs constantly. The problem has been unchanged. The maximum temperature noted was 100 to 100.9 F. Associated symptoms include coughing. Pertinent negatives include no abdominal pain, chest pain, congestion, headaches, rash or sore throat.  Cough This is a new problem. The current episode started yesterday. The problem has been gradually worsening. The problem occurs constantly. The cough is Non-productive. Associated symptoms include chills and a fever. Pertinent negatives include no chest pain, headaches, rash or sore throat.      Review of Systems  Constitutional:  Positive for chills and fever. Negative for malaise/fatigue.  HENT: Negative.  Negative for congestion and sore throat.   Respiratory:  Positive for cough.    Cardiovascular: Negative.  Negative for chest pain.  Gastrointestinal:  Negative for abdominal pain.  Genitourinary: Negative.   Musculoskeletal: Negative.   Skin: Negative.  Negative for itching and rash.  Neurological: Negative.  Negative for headaches.     Observations/Objective: Tele visit patient is not in distress  Assessment and Plan: Cough and fever symptoms not well controlled, completed Covid, flu, RSV labs completed results pending Take meds as prescribed - Use a cool mist humidifier  -Use saline nose sprays frequently -Force fluids -For fever or aches or pains- take Tylenol or ibuprofen. -If symptoms do not improve, she may need to be COVID tested to rule this out   Follow Up Instructions:  Follow up with worsening unresolved symptoms    I discussed the assessment and treatment plan with the patient. The patient was provided an opportunity to ask questions and all were answered. The patient agreed with the plan and demonstrated an understanding of the instructions.   The patient was advised to call back or seek an in-person evaluation if the symptoms worsen or if the condition fails to improve as anticipated.  The above assessment and management plan was discussed with the patient. The patient verbalized understanding of and has agreed to the management plan. Patient is aware to call the clinic if symptoms persist or worsen. Patient is aware when to return to the clinic for a follow-up visit. Patient educated on when it is appropriate to go to the emergency department.   Time call ended: 4:42 pm     I provided 11 minutes of  non face-to-face time during this encounter.    Daryll Drown, NP

## 2022-08-26 NOTE — Patient Instructions (Signed)
Fever, Adult     A fever is an increase in your body's temperature. It often means a temperature of 100.4F (38C) or higher. Brief mild or moderate fevers often have no long-term effects. They often do not need treatment. Moderate or high fevers may make you feel uncomfortable. Sometimes, they can be a sign of a serious illness or disease. A fever that keeps coming back or that lasts a long time may cause you to lose water in your body (get dehydrated). You can take your temperature with a thermometer to see if you have a fever. Temperature can change with: Age. Time of day. Where the thermometer is put in the body. Readings may vary when the thermometer is put: In the mouth (oral). In the butt (rectal). In the ear (tympanic). Under the arm (axillary). On the forehead (temporal). Follow these instructions at home: Medicines Take over-the-counter and prescription medicines only as told by your doctor. Follow the dosing instructions carefully. If you were prescribed an antibiotic medicine, take it as told by your doctor. Do not stop taking it even if you start to feel better. General instructions Watch for any changes in your symptoms. Tell your doctor about them. Rest as needed. Drink enough fluid to keep your pee (urine) pale yellow. Sponge yourself or bathe with room-temperature water as needed. This helps to lower your body temperature. Do not use ice water. Do not use too many blankets or wear clothes that are too heavy. If your fever was caused by an infection that spreads from person to person (is contagious), such as a cold or the flu: You should stay home from work and public places for at least 24 hours after your fever is gone. Your fever should be gone for at least 24 hours without the need to use medicines. Contact a doctor if: You throw up (vomit). You cannot eat or drink without throwing up. You have watery poop (diarrhea). It hurts when you pee. Your symptoms do not get  better with treatment. You have new symptoms. You feel very weak. Get help right away if: You are short of breath or have trouble breathing. You are dizzy or you pass out (faint). You feel mixed up (confused). You have signs of not having enough water in your body, such as: Dark pee, very little pee, or no pee. Cracked lips. Dry mouth. Sunken eyes. Sleepiness. Weakness. You have very bad pain in your belly (abdomen). You keep throwing up or having watery poop. You have a rash on your skin. Your symptoms get worse all of a sudden. Summary A fever is an increase in your body's temperature. It often means a temperature of 100.4F (38C) or higher. Watch for any changes in your symptoms. Tell your doctor about them. Take all medicines only as told by your doctor. Do not go to work or other public places if your fever was caused by an illness that can spread to other people. Get help right away if you have signs that you do not have enough water in your body. This information is not intended to replace advice given to you by your health care provider. Make sure you discuss any questions you have with your health care provider. Document Revised: 04/01/2022 Document Reviewed: 04/24/2021 Elsevier Patient Education  2023 Elsevier Inc.  

## 2022-08-27 LAB — COVID-19, FLU A+B AND RSV
Influenza A, NAA: NOT DETECTED
Influenza B, NAA: NOT DETECTED
RSV, NAA: NOT DETECTED
SARS-CoV-2, NAA: NOT DETECTED

## 2023-02-26 DIAGNOSIS — N4883 Acquired buried penis: Secondary | ICD-10-CM | POA: Diagnosis not present

## 2023-02-26 DIAGNOSIS — N471 Phimosis: Secondary | ICD-10-CM | POA: Diagnosis not present

## 2023-02-26 DIAGNOSIS — N480 Leukoplakia of penis: Secondary | ICD-10-CM | POA: Diagnosis not present

## 2023-07-22 ENCOUNTER — Other Ambulatory Visit: Payer: Self-pay | Admitting: Family

## 2023-07-22 ENCOUNTER — Other Ambulatory Visit: Payer: Self-pay | Admitting: Nurse Practitioner

## 2023-07-22 DIAGNOSIS — J4540 Moderate persistent asthma, uncomplicated: Secondary | ICD-10-CM

## 2023-07-22 DIAGNOSIS — J441 Chronic obstructive pulmonary disease with (acute) exacerbation: Secondary | ICD-10-CM

## 2023-07-22 NOTE — Telephone Encounter (Signed)
MMM NTBS last chronic FU 11/12/21 NO RFs will be sent to pharmacy last FU greater than a year

## 2023-07-22 NOTE — Telephone Encounter (Signed)
Appt made 07/29/23

## 2023-07-29 ENCOUNTER — Ambulatory Visit: Payer: BC Managed Care – PPO | Admitting: Nurse Practitioner

## 2023-07-29 ENCOUNTER — Encounter: Payer: Self-pay | Admitting: Nurse Practitioner

## 2023-07-29 VITALS — BP 134/84 | HR 92 | Temp 98.1°F | Resp 20 | Ht 72.0 in | Wt 278.0 lb

## 2023-07-29 DIAGNOSIS — J441 Chronic obstructive pulmonary disease with (acute) exacerbation: Secondary | ICD-10-CM

## 2023-07-29 DIAGNOSIS — J4489 Other specified chronic obstructive pulmonary disease: Secondary | ICD-10-CM | POA: Diagnosis not present

## 2023-07-29 DIAGNOSIS — J4540 Moderate persistent asthma, uncomplicated: Secondary | ICD-10-CM

## 2023-07-29 DIAGNOSIS — N400 Enlarged prostate without lower urinary tract symptoms: Secondary | ICD-10-CM

## 2023-07-29 DIAGNOSIS — Z6841 Body Mass Index (BMI) 40.0 and over, adult: Secondary | ICD-10-CM

## 2023-07-29 DIAGNOSIS — B372 Candidiasis of skin and nail: Secondary | ICD-10-CM

## 2023-07-29 DIAGNOSIS — E66813 Obesity, class 3: Secondary | ICD-10-CM

## 2023-07-29 MED ORDER — BUDESONIDE-FORMOTEROL FUMARATE 160-4.5 MCG/ACT IN AERO
2.0000 | INHALATION_SPRAY | Freq: Two times a day (BID) | RESPIRATORY_TRACT | 1 refills | Status: DC
Start: 2023-07-29 — End: 2024-07-29

## 2023-07-29 MED ORDER — CLOBETASOL PROPIONATE 0.05 % EX OINT: 1.0000 | TOPICAL_OINTMENT | Freq: Two times a day (BID) | CUTANEOUS | 1 refills | Status: AC

## 2023-07-29 MED ORDER — ALBUTEROL SULFATE HFA 108 (90 BASE) MCG/ACT IN AERS: 2.0000 | INHALATION_SPRAY | Freq: Four times a day (QID) | RESPIRATORY_TRACT | 0 refills | Status: DC | PRN

## 2023-07-29 MED ORDER — NYSTATIN 100000 UNIT/GM EX POWD
Freq: Three times a day (TID) | CUTANEOUS | 2 refills | Status: DC
Start: 2023-07-29 — End: 2024-07-29

## 2023-07-29 NOTE — Progress Notes (Signed)
Subjective:    Patient ID: [name], male    DOB: 23-Jan-1956, 67 y.o.   MRN: [id number]   Chief Complaint: Medical Management of Chronic Issues    HPI:  [name] is a 67 y.o. who identifies as a male who was assigned male at birth.   Social history: Lives with: wife Work history: works from home   Comes in today for follow up of the following chronic medical issues:  1. COPD with asthma Doing good. Very seldom needs albuterol. Does well on symbicort daily  2. Benign prostatic hyperplasia without lower urinary tract symptoms No voiding issues No results found for: "PSA1", "PSA"    3. Class 3 severe obesity due to excess calories without serious comorbidity with body mass index (BMI) of 40.0 to 44.9 in adult [facility]) Weight is down 9lbs Wt Readings from Last 3 Encounters:  07/29/23 278 lb (126.1 kg)  12/22/20 287 lb (130.2 kg)  03/31/18 276 lb 3.2 oz (125.3 kg)   BMI Readings from Last 3 Encounters:  07/29/23 37.70 kg/m  12/22/20 38.92 kg/m  03/31/18 37.46 kg/m      New complaints: None  today  Allergies  Allergen Reactions   Diclofenac Swelling   Outpatient Encounter Medications as of 07/29/2023  Medication Sig   acetaminophen (TYLENOL) 500 MG tablet Take 1 tablet (500 mg total) by mouth every 6 (six) hours as needed.   albuterol (VENTOLIN HFA) 108 (90 Base) MCG/ACT inhaler Inhale 2 puffs into the lungs every 6 (six) hours as needed for wheezing or shortness of breath.   betamethasone valerate ointment (VALISONE) 0.1 % Apply 1 application topically 2 (two) times daily.   budesonide-formoterol (SYMBICORT) 160-4.5 MCG/ACT inhaler Inhale 2 puffs into the lungs 2 (two) times daily. **NEEDS TO BE SEEN BEFORE NEXT REFILL**   famotidine (PEPCID) 20 MG tablet Take 1 tablet (20 mg total) by mouth 2 (two) times daily.   ketoconazole (NIZORAL) 2 % cream Apply 1 application topically daily.   loratadine (CLARITIN) 10 MG tablet Take 10 mg by mouth 2 (two)  times daily.   nystatin (MYCOSTATIN/NYSTOP) powder Apply topically 3 (three) times daily.   [DISCONTINUED] ciclopirox (LOPROX) 0.77 % cream Apply topically 2 (two) times daily. Fungal infection   [DISCONTINUED] dextromethorphan-guaiFENesin (MUCINEX DM) 30-600 MG 12hr tablet Take 1 tablet by mouth 2 (two) times daily.   [DISCONTINUED] doxycycline (VIBRA-TABS) 100 MG tablet Take 1 tablet (100 mg total) by mouth 2 (two) times daily.   [DISCONTINUED] fluticasone (FLONASE) 50 MCG/ACT nasal spray Place 2 sprays into both nostrils daily.   [DISCONTINUED] montelukast (SINGULAIR) 10 MG tablet Take 1 tablet (10 mg total) by mouth at bedtime.   [DISCONTINUED] predniSONE (STERAPRED UNI-PAK 21 TAB) 10 MG (21) TBPK tablet Use as directed   No facility-administered encounter medications on file as of 07/29/2023.    Past Surgical History:  Procedure Laterality Date   CHOLECYSTECTOMY     HERNIA REPAIR     NASAL SINUS SURGERY     TRIGGER FINGER RELEASE      Family History  Problem Relation Age of Onset   Heart disease Father    Liver cancer Brother    Asthma Son       Controlled substance contract: n/a     Review of Systems  Constitutional:  Negative for diaphoresis.  Eyes:  Negative for pain.  Respiratory:  Negative for shortness of breath.   Cardiovascular:  Negative for chest pain, palpitations and leg swelling.  Gastrointestinal:  Negative for abdominal pain.  Endocrine: Negative for polydipsia.  Skin:  Negative for rash.  Neurological:  Negative for dizziness, weakness and headaches.  Hematological:  Does not bruise/bleed easily.  All other systems reviewed and are negative.      Objective:   Physical Exam Vitals and nursing note reviewed.  Constitutional:      Appearance: Normal appearance. He is well-developed.  HENT:     Head: Normocephalic.     Nose: Nose normal.     Mouth/Throat:     Mouth: Mucous membranes are moist.     Pharynx: Oropharynx is clear.  Eyes:      Pupils: Pupils are equal, round, and reactive to light.  Neck:     Thyroid: No thyroid mass or thyromegaly.     Vascular: No carotid bruit or JVD.     Trachea: Phonation normal.  Cardiovascular:     Rate and Rhythm: Normal rate and regular rhythm.  Pulmonary:     Effort: Pulmonary effort is normal. No respiratory distress.     Breath sounds: Normal breath sounds.  Abdominal:     General: Bowel sounds are normal.     Palpations: Abdomen is soft.     Tenderness: There is no abdominal tenderness.  Musculoskeletal:        General: Normal range of motion.     Cervical back: Normal range of motion and neck supple.  Lymphadenopathy:     Cervical: No cervical adenopathy.  Skin:    General: Skin is warm and dry.  Neurological:     Mental Status: He is alert and oriented to person, place, and time.  Psychiatric:        Behavior: Behavior normal.        Thought Content: Thought content normal.        Judgment: Judgment normal.    BP 134/84   Pulse 92   Temp 98.1 F (36.7 C) (Temporal)   Resp 20   Ht 6' (1.829 m)   Wt 278 lb (126.1 kg)   SpO2 90%   BMI 37.70 kg/m         Assessment & Plan:   [name] comes in today with chief complaint of Medical Management of Chronic Issues   Diagnosis and orders addressed:  1. COPD with asthma - CBC with Differential/Platelet - CMP14+EGFR - Lipid panel  2. Benign prostatic hyperplasia without lower urinary tract symptoms Report any voiding issues - PSA, total and free  3. Class 3 severe obesity due to excess calories without serious comorbidity with body mass index (BMI) of 40.0 to 44.9 in adult [facility]) Discussed diet and exercise for person with BMI >25 Will recheck weight in 3-6 months   4. Moderate persistent asthma, unspecified whether complicated - budesonide-formoterol (SYMBICORT) 160-4.5 MCG/ACT inhaler; Inhale 2 puffs into the lungs 2 (two) times daily. **NEEDS TO BE SEEN BEFORE NEXT REFILL**  Dispense: 30.6 each;  Refill: 1  5. COPD exacerbation (HCC) - albuterol (VENTOLIN HFA) 108 (90 Base) MCG/ACT inhaler; Inhale 2 puffs into the lungs every 6 (six) hours as needed for wheezing or shortness of breath.  Dispense: 54 g; Refill: 0  6. Intertriginous candidiasis Avoid scratching Keep area clean and dry  - nystatin (MYCOSTATIN/NYSTOP) powder; Apply topically 3 (three) times daily.  Dispense: 60 g; Refill: 2   Labs pending Health Maintenance reviewed Diet and exercise encouraged  Follow up plan: 1 year   Mary-Margaret Daphine Deutscher, FNP

## 2023-07-29 NOTE — Patient Instructions (Signed)
Asthma, Adult  Asthma is a condition that causes swelling and narrowing of the airways. These are the passages that lead from the nose and mouth down into the lungs. When asthma symptoms get worse it is called an asthma attack or flare. This can make it hard to breathe. Asthma flares can range from minor to life-threatening. There is no cure for asthma, but medicines and lifestyle changes can help to control it. What are the causes? It is not known exactly what causes asthma, but certain things can cause asthma symptoms to get worse (triggers). What can trigger an asthma attack? Cigarette smoke. Mold. Dust. Your pet's skin flakes (dander). Cockroaches. Pollen. Air pollution (like household cleaners, wood smoke, smog, or chemical odors). What are the signs or symptoms? Trouble breathing (shortness of breath). Coughing. Making high-pitched whistling sounds when you breathe, most often when you breathe out (wheezing). Chest tightness. Tiredness with little activity. Poor exercise tolerance. How is this treated? Controller medicines that help prevent asthma symptoms. Fast-acting reliever or rescue medicines. These give short-term relief of asthma symptoms. Allergy medicines if your attacks are brought on by allergens. Medicines to help control the body's defense (immune) system. Staying away from the things that cause asthma attacks. Follow these instructions at home: Avoiding triggers in your home Do not allow anyone to smoke in your home. Limit use of fireplaces and wood stoves. Get rid of pests (such as roaches and mice) and their droppings. Keep your home clean. Clean your floors. Dust regularly. Use cleaning products that do not smell. Wash bed sheets and blankets every week in hot water. Dry them in a dryer. Have someone vacuum when you are not home. Change your heating and air conditioning filters often. Use blankets that are made of polyester or cotton. General  instructions Take over-the-counter and prescription medicines only as told by your doctor. Do not smoke or use any products that contain nicotine or tobacco. If you need help quitting, ask your doctor. Stay away from secondhand smoke. Avoid doing things outdoors when allergen counts are high and when air quality is low. Warm up before you exercise. Take time to cool down after exercise. Use a peak flow meter as told by your doctor. A peak flow meter is a tool that measures how well your lungs are working. Keep track of the peak flow meter's readings. Write them down. Follow your asthma action plan. This is a written plan for taking care of your asthma and treating your attacks. Make sure you get all the shots (vaccines) that your doctor recommends. Ask your doctor about a flu shot and a pneumonia shot. Keep all follow-up visits. Contact a doctor if: You have wheezing, shortness of breath, or a cough even while taking medicine to prevent attacks. The mucus you cough up (sputum) is thicker than usual. The mucus you cough up changes from clear or white to yellow, green, gray, or is bloody. You have problems from the medicine you are taking, such as: A rash. Itching. Swelling. Trouble breathing. You need reliever medicines more than 2-3 times a week. Your peak flow reading is still at 50-79% of your personal best after following the action plan for 1 hour. You have a fever. Get help right away if: You seem to be worse and are not responding to medicine during an asthma attack. You are short of breath even at rest. You get short of breath when doing very little activity. You have trouble eating, drinking, or talking. You have chest   pain or tightness. You have a fast heartbeat. Your lips or fingernails start to turn blue. You are light-headed or dizzy, or you faint. Your peak flow is less than 50% of your personal best. You feel too tired to breathe normally. These symptoms may be an  emergency. Get help right away. Call 911. Do not wait to see if the symptoms will go away. Do not drive yourself to the hospital. Summary Asthma is a long-term (chronic) condition in which the airways get tight and narrow. An asthma attack can make it hard to breathe. Asthma cannot be cured, but medicines and lifestyle changes can help control it. Make sure you understand how to avoid triggers and how and when to use your medicines. Avoid things that can cause allergy symptoms (allergens). These include animal skin flakes (dander) and pollen from trees or grass. Avoid things that pollute the air. These may include household cleaners, wood smoke, smog, or chemical odors. This information is not intended to replace advice given to you by your health care provider. Make sure you discuss any questions you have with your health care provider. Document Revised: 09/10/2021 Document Reviewed: 09/10/2021 Elsevier Patient Education  2024 Elsevier Inc.  

## 2023-08-01 ENCOUNTER — Other Ambulatory Visit (HOSPITAL_COMMUNITY): Payer: Self-pay

## 2023-08-01 ENCOUNTER — Telehealth: Payer: Self-pay

## 2023-08-01 NOTE — Telephone Encounter (Signed)
Pharmacy Patient Advocate Encounter   Received notification from CoverMyMeds that prior authorization for Symbicort 160-4.5MCG/ACT aerosol is required/requested.   Insurance verification completed.   The patient is insured through [employer] [facility] .   Per test claim: PA required; PA submitted to [employer] [facility] via CoverMyMeds Key/confirmation #/[id number] Status is pending

## 2023-08-04 ENCOUNTER — Telehealth: Payer: Self-pay

## 2023-08-04 ENCOUNTER — Other Ambulatory Visit: Payer: Self-pay | Admitting: Nurse Practitioner

## 2023-08-04 MED ORDER — FLUTICASONE FUROATE-VILANTEROL 100-25 MCG/ACT IN AEPB: 1.0000 | INHALATION_SPRAY | Freq: Every day | RESPIRATORY_TRACT | 5 refills | Status: DC

## 2023-08-04 NOTE — Telephone Encounter (Signed)
Pharmacy Patient Advocate Encounter  Received notification from [facility] that Prior Authorization for Symbicort 160-4.5MCG/ACT aerosol has been DENIED. Please advise how you'd like to proceed. Full denial letter will be uploaded to the media tab. See denial reason below.   PA #/Case ID/Reference #: [id number]    Denial Reason: Must try and fail preferred drugs or have medical reason why patient cannot take. Preferred drugs: fluticasone-salmeterol ([id number], [id number]), Wixela Inhub, BREO ELLIPTA (except certain NDCs). (Requirement: 2 alternatives.)

## 2023-08-04 NOTE — Telephone Encounter (Signed)
Please let patient know insurance will not cover symbicort. Has he done BREO or advair in th epast?

## 2023-08-04 NOTE — Telephone Encounter (Signed)
Called and spoke with patient. He states that he tried Advair in the past and didn't work well. He is willing to try to [facility]. Please send to pharmacy

## 2023-08-04 NOTE — Telephone Encounter (Signed)
Will do BREO and see if insurance will pay for meds

## 2023-08-04 NOTE — Telephone Encounter (Signed)
Daivion [facility]field [facility] ([facility]) Rx #: [id number] Need Help? Call us at [phone] Status sent iconSent to Plan today Drug Budesonide-Formoterol Fumarate 160-4.5MCG/ACT aerosol ePA cloud Psychologist, educational Electronic PA Form [phone] NCPDP)

## 2023-08-05 NOTE — Telephone Encounter (Signed)
PA previously denied. Please see encounter on 08/01/23. Per encounter, changed to [facility].   PA not submitted. Archiving request.

## 2023-08-05 NOTE — Telephone Encounter (Signed)
Pt aware Breo sent in.

## 2023-09-10 DIAGNOSIS — E669 Obesity, unspecified: Secondary | ICD-10-CM | POA: Diagnosis not present

## 2023-09-10 DIAGNOSIS — N4883 Acquired buried penis: Secondary | ICD-10-CM | POA: Diagnosis not present

## 2023-09-10 DIAGNOSIS — R7989 Other specified abnormal findings of blood chemistry: Secondary | ICD-10-CM | POA: Diagnosis not present

## 2023-09-10 DIAGNOSIS — N480 Leukoplakia of penis: Secondary | ICD-10-CM | POA: Diagnosis not present

## 2023-10-31 DIAGNOSIS — N4883 Acquired buried penis: Secondary | ICD-10-CM | POA: Diagnosis not present

## 2024-01-09 DIAGNOSIS — Z01818 Encounter for other preprocedural examination: Secondary | ICD-10-CM | POA: Diagnosis not present

## 2024-01-23 DIAGNOSIS — T8189XA Other complications of procedures, not elsewhere classified, initial encounter: Secondary | ICD-10-CM | POA: Diagnosis not present

## 2024-01-24 DIAGNOSIS — T8189XA Other complications of procedures, not elsewhere classified, initial encounter: Secondary | ICD-10-CM | POA: Diagnosis not present

## 2024-01-25 DIAGNOSIS — T8189XA Other complications of procedures, not elsewhere classified, initial encounter: Secondary | ICD-10-CM | POA: Diagnosis not present

## 2024-01-26 DIAGNOSIS — Z79899 Other long term (current) drug therapy: Secondary | ICD-10-CM | POA: Diagnosis not present

## 2024-01-26 DIAGNOSIS — N35919 Unspecified urethral stricture, male, unspecified site: Secondary | ICD-10-CM | POA: Diagnosis not present

## 2024-01-26 DIAGNOSIS — T8189XA Other complications of procedures, not elsewhere classified, initial encounter: Secondary | ICD-10-CM | POA: Diagnosis not present

## 2024-01-26 DIAGNOSIS — N368 Other specified disorders of urethra: Secondary | ICD-10-CM | POA: Diagnosis not present

## 2024-01-26 DIAGNOSIS — N4883 Acquired buried penis: Secondary | ICD-10-CM | POA: Diagnosis not present

## 2024-01-26 DIAGNOSIS — N480 Leukoplakia of penis: Secondary | ICD-10-CM | POA: Diagnosis not present

## 2024-01-26 DIAGNOSIS — L900 Lichen sclerosus et atrophicus: Secondary | ICD-10-CM | POA: Diagnosis not present

## 2024-01-26 DIAGNOSIS — N369 Urethral disorder, unspecified: Secondary | ICD-10-CM | POA: Diagnosis not present

## 2024-01-27 DIAGNOSIS — N35919 Unspecified urethral stricture, male, unspecified site: Secondary | ICD-10-CM | POA: Diagnosis not present

## 2024-01-27 DIAGNOSIS — Z79899 Other long term (current) drug therapy: Secondary | ICD-10-CM | POA: Diagnosis not present

## 2024-01-27 DIAGNOSIS — N4883 Acquired buried penis: Secondary | ICD-10-CM | POA: Diagnosis not present

## 2024-01-27 DIAGNOSIS — T8189XA Other complications of procedures, not elsewhere classified, initial encounter: Secondary | ICD-10-CM | POA: Diagnosis not present

## 2024-01-27 DIAGNOSIS — N480 Leukoplakia of penis: Secondary | ICD-10-CM | POA: Diagnosis not present

## 2024-01-27 DIAGNOSIS — N368 Other specified disorders of urethra: Secondary | ICD-10-CM | POA: Diagnosis not present

## 2024-01-28 DIAGNOSIS — T8189XA Other complications of procedures, not elsewhere classified, initial encounter: Secondary | ICD-10-CM | POA: Diagnosis not present

## 2024-01-29 DIAGNOSIS — T8189XA Other complications of procedures, not elsewhere classified, initial encounter: Secondary | ICD-10-CM | POA: Diagnosis not present

## 2024-01-30 DIAGNOSIS — T8189XA Other complications of procedures, not elsewhere classified, initial encounter: Secondary | ICD-10-CM | POA: Diagnosis not present

## 2024-01-31 DIAGNOSIS — T8189XA Other complications of procedures, not elsewhere classified, initial encounter: Secondary | ICD-10-CM | POA: Diagnosis not present

## 2024-02-01 DIAGNOSIS — T8189XA Other complications of procedures, not elsewhere classified, initial encounter: Secondary | ICD-10-CM | POA: Diagnosis not present

## 2024-02-02 DIAGNOSIS — T8189XA Other complications of procedures, not elsewhere classified, initial encounter: Secondary | ICD-10-CM | POA: Diagnosis not present

## 2024-02-03 DIAGNOSIS — T8189XA Other complications of procedures, not elsewhere classified, initial encounter: Secondary | ICD-10-CM | POA: Diagnosis not present

## 2024-02-04 DIAGNOSIS — T8189XA Other complications of procedures, not elsewhere classified, initial encounter: Secondary | ICD-10-CM | POA: Diagnosis not present

## 2024-02-05 DIAGNOSIS — N4883 Acquired buried penis: Secondary | ICD-10-CM | POA: Diagnosis not present

## 2024-02-05 DIAGNOSIS — T8189XA Other complications of procedures, not elsewhere classified, initial encounter: Secondary | ICD-10-CM | POA: Diagnosis not present

## 2024-02-06 DIAGNOSIS — T8189XA Other complications of procedures, not elsewhere classified, initial encounter: Secondary | ICD-10-CM | POA: Diagnosis not present

## 2024-02-07 DIAGNOSIS — T8189XA Other complications of procedures, not elsewhere classified, initial encounter: Secondary | ICD-10-CM | POA: Diagnosis not present

## 2024-02-08 DIAGNOSIS — T8189XA Other complications of procedures, not elsewhere classified, initial encounter: Secondary | ICD-10-CM | POA: Diagnosis not present

## 2024-02-09 DIAGNOSIS — T8189XA Other complications of procedures, not elsewhere classified, initial encounter: Secondary | ICD-10-CM | POA: Diagnosis not present

## 2024-02-10 DIAGNOSIS — T8189XA Other complications of procedures, not elsewhere classified, initial encounter: Secondary | ICD-10-CM | POA: Diagnosis not present

## 2024-02-11 DIAGNOSIS — T8189XA Other complications of procedures, not elsewhere classified, initial encounter: Secondary | ICD-10-CM | POA: Diagnosis not present

## 2024-02-12 DIAGNOSIS — T8189XA Other complications of procedures, not elsewhere classified, initial encounter: Secondary | ICD-10-CM | POA: Diagnosis not present

## 2024-02-13 DIAGNOSIS — N4883 Acquired buried penis: Secondary | ICD-10-CM | POA: Diagnosis not present

## 2024-02-13 DIAGNOSIS — T8189XA Other complications of procedures, not elsewhere classified, initial encounter: Secondary | ICD-10-CM | POA: Diagnosis not present

## 2024-02-19 DIAGNOSIS — N4883 Acquired buried penis: Secondary | ICD-10-CM | POA: Diagnosis not present

## 2024-02-19 DIAGNOSIS — N35911 Unspecified urethral stricture, male, meatal: Secondary | ICD-10-CM | POA: Diagnosis not present

## 2024-02-27 DIAGNOSIS — N4883 Acquired buried penis: Secondary | ICD-10-CM | POA: Diagnosis not present

## 2024-03-16 DIAGNOSIS — Z09 Encounter for follow-up examination after completed treatment for conditions other than malignant neoplasm: Secondary | ICD-10-CM | POA: Diagnosis not present

## 2024-03-24 DIAGNOSIS — N4883 Acquired buried penis: Secondary | ICD-10-CM | POA: Diagnosis not present

## 2024-04-13 DIAGNOSIS — Z09 Encounter for follow-up examination after completed treatment for conditions other than malignant neoplasm: Secondary | ICD-10-CM | POA: Diagnosis not present

## 2024-06-23 DIAGNOSIS — N480 Leukoplakia of penis: Secondary | ICD-10-CM | POA: Diagnosis not present

## 2024-06-23 DIAGNOSIS — N4883 Acquired buried penis: Secondary | ICD-10-CM | POA: Diagnosis not present

## 2024-06-23 DIAGNOSIS — N35911 Unspecified urethral stricture, male, meatal: Secondary | ICD-10-CM | POA: Diagnosis not present

## 2024-06-23 DIAGNOSIS — E669 Obesity, unspecified: Secondary | ICD-10-CM | POA: Diagnosis not present

## 2024-07-29 ENCOUNTER — Encounter: Payer: Self-pay | Admitting: Nurse Practitioner

## 2024-07-29 ENCOUNTER — Ambulatory Visit (INDEPENDENT_AMBULATORY_CARE_PROVIDER_SITE_OTHER): Payer: BC Managed Care – PPO | Admitting: Nurse Practitioner

## 2024-07-29 VITALS — BP 126/78 | HR 88 | Temp 98.3°F | Ht 72.0 in | Wt 284.0 lb

## 2024-07-29 DIAGNOSIS — Z1329 Encounter for screening for other suspected endocrine disorder: Secondary | ICD-10-CM | POA: Diagnosis not present

## 2024-07-29 DIAGNOSIS — N400 Enlarged prostate without lower urinary tract symptoms: Secondary | ICD-10-CM

## 2024-07-29 DIAGNOSIS — J4489 Other specified chronic obstructive pulmonary disease: Secondary | ICD-10-CM

## 2024-07-29 DIAGNOSIS — Z0000 Encounter for general adult medical examination without abnormal findings: Secondary | ICD-10-CM | POA: Diagnosis not present

## 2024-07-29 DIAGNOSIS — Z0001 Encounter for general adult medical examination with abnormal findings: Secondary | ICD-10-CM

## 2024-07-29 DIAGNOSIS — E66813 Obesity, class 3: Secondary | ICD-10-CM | POA: Diagnosis not present

## 2024-07-29 DIAGNOSIS — Z125 Encounter for screening for malignant neoplasm of prostate: Secondary | ICD-10-CM | POA: Diagnosis not present

## 2024-07-29 DIAGNOSIS — Z6841 Body Mass Index (BMI) 40.0 and over, adult: Secondary | ICD-10-CM

## 2024-07-29 MED ORDER — BUDESONIDE-FORMOTEROL FUMARATE 160-4.5 MCG/ACT IN AERO: 2.0000 | INHALATION_SPRAY | Freq: Two times a day (BID) | RESPIRATORY_TRACT | 1 refills | Status: DC

## 2024-07-29 NOTE — Progress Notes (Signed)
 Subjective:    Patient ID: [name], male    DOB: Feb 10, 1956, 68 y.o.   MRN: [id number]   Chief Complaint: annual physical   HPI:  [name] is a 68 y.o. who identifies as a male who was assigned male at birth.   Social history: Lives with: wife Work history: works from home   Comes in today for follow up of the following chronic medical issues:  1. COPD with asthma [facility]) He is on BREO and does not like how it makes him feel. Wants to go back on symbicort . Denis sob or coughing  2. Benign prostatic hyperplasia without lower urinary tract symptoms No voiding issues  3. Class 3 severe obesity due to excess calories without serious comorbidity with body mass index (BMI) of 40.0 to 44.9 in adult Weight is up 6 lbs Wt Readings from Last 3 Encounters:  07/29/24 284 lb (128.8 kg)  07/29/23 278 lb (126.1 kg)  12/22/20 287 lb (130.2 kg)   BMI Readings from Last 3 Encounters:  07/29/24 38.52 kg/m  07/29/23 37.70 kg/m  12/22/20 38.92 kg/m      New complaints: None today  Allergies  Allergen Reactions   Diclofenac  Swelling   Outpatient Encounter Medications as of 07/29/2024  Medication Sig   acetaminophen  (TYLENOL ) 500 MG tablet Take 1 tablet (500 mg total) by mouth every 6 (six) hours as needed.   albuterol  (VENTOLIN  HFA) 108 (90 Base) MCG/ACT inhaler Inhale 2 puffs into the lungs every 6 (six) hours as needed for wheezing or shortness of breath.   budesonide -formoterol  (SYMBICORT ) 160-4.5 MCG/ACT inhaler Inhale 2 puffs into the lungs 2 (two) times daily. **NEEDS TO BE SEEN BEFORE NEXT REFILL**   clobetasol  ointment (TEMOVATE ) 0.05 % Apply 1 Application topically 2 (two) times daily.   fluticasone  furoate-vilanterol (BREO ELLIPTA ) 100-25 MCG/ACT AEPB Inhale 1 puff into the lungs daily.   ketoconazole  (NIZORAL ) 2 % cream Apply 1 application topically daily.   loratadine (CLARITIN) 10 MG tablet Take 10 mg by mouth 2 (two) times daily.   nystatin   (MYCOSTATIN /NYSTOP ) powder Apply topically 3 (three) times daily.   No facility-administered encounter medications on file as of 07/29/2024.    Past Surgical History:  Procedure Laterality Date   CHOLECYSTECTOMY     HERNIA REPAIR     NASAL SINUS SURGERY     TRIGGER FINGER RELEASE      Family History  Problem Relation Age of Onset   Heart disease Father    Liver cancer Brother    Asthma Son       Controlled substance contract: n/a     Review of Systems  Constitutional:  Negative for diaphoresis.  Eyes:  Negative for pain.  Respiratory:  Negative for shortness of breath.   Cardiovascular:  Negative for chest pain, palpitations and leg swelling.  Gastrointestinal:  Negative for abdominal pain.  Endocrine: Negative for polydipsia.  Skin:  Negative for rash.  Neurological:  Negative for dizziness, weakness and headaches.  Hematological:  Does not bruise/bleed easily.  All other systems reviewed and are negative.      Objective:   Physical Exam Vitals and nursing note reviewed.  Constitutional:      Appearance: Normal appearance. He is well-developed.  HENT:     Head: Normocephalic.     Nose: Nose normal.     Mouth/Throat:     Mouth: Mucous membranes are moist.     Pharynx: Oropharynx is clear.  Eyes:     Pupils:  Pupils are equal, round, and reactive to light.  Neck:     Thyroid : No thyroid  mass or thyromegaly.     Vascular: No carotid bruit or JVD.     Trachea: Phonation normal.  Cardiovascular:     Rate and Rhythm: Normal rate and regular rhythm.  Pulmonary:     Effort: Pulmonary effort is normal. No respiratory distress.     Breath sounds: Normal breath sounds.  Abdominal:     General: Bowel sounds are normal.     Palpations: Abdomen is soft.     Tenderness: There is no abdominal tenderness.  Musculoskeletal:        General: Normal range of motion.     Cervical back: Normal range of motion and neck supple.  Lymphadenopathy:     Cervical: No cervical  adenopathy.  Skin:    General: Skin is warm and dry.  Neurological:     Mental Status: He is alert and oriented to person, place, and time.  Psychiatric:        Behavior: Behavior normal.        Thought Content: Thought content normal.        Judgment: Judgment normal.    BP 126/78   Pulse 88   Temp 98.3 F (36.8 C) (Temporal)   Ht 6' (1.829 m)   Wt 284 lb (128.8 kg)   SpO2 98%   BMI 38.52 kg/m         Assessment & Plan:  [name] comes in today with chief complaint of Annual Exam   Diagnosis and orders addressed:  1. Annual physical exam (Primary) Labs pending - CBC with Differential/Platelet - CMP14+EGFR - Lipid panel - PSA, total and free - Thyroid  Panel With TSH - VITAMIN D  25 Hydroxy (Vit-D Deficiency, Fractures)  2. COPD with asthma [facility]) Patient will let me know if insurance will not cover symbicort  - budesonide -formoterol  (SYMBICORT ) 160-4.5 MCG/ACT inhaler; Inhale 2 puffs into the lungs 2 (two) times daily. **NEEDS TO BE SEEN BEFORE NEXT REFILL**  Dispense: 30.6 each; Refill: 1  3. Benign prostatic hyperplasia without lower urinary tract symptoms Report any voiding issues  4. Class 3 severe obesity due to excess calories without serious comorbidity with body mass index (BMI) of 40.0 to 44.9 in adult Discussed diet and exercise for person with BMI >25 Will recheck weight in 3-6 months    Labs pending Health Maintenance reviewed Diet and exercise encouraged  Follow up plan: prn   Mary-Margaret Gladis, FNP

## 2024-07-29 NOTE — Patient Instructions (Signed)
 Exercising to Stay Healthy To become healthy and stay healthy, it is recommended that you do moderate-intensity and vigorous-intensity exercise. You can tell that you are exercising at a moderate intensity if your heart starts beating faster and you start breathing faster but can still hold a conversation. You can tell that you are exercising at a vigorous intensity if you are breathing much harder and faster and cannot hold a conversation while exercising. How can exercise benefit me? Exercising regularly is important. It has many health benefits, such as: Improving overall fitness, flexibility, and endurance. Increasing bone density. Helping with weight control. Decreasing body fat. Increasing muscle strength and endurance. Reducing stress and tension, anxiety, depression, or anger. Improving overall health. What guidelines should I follow while exercising? Before you start a new exercise program, talk with your health care provider. Do not exercise so much that you hurt yourself, feel dizzy, or get very short of breath. Wear comfortable clothes and wear shoes with good support. Drink plenty of water while you exercise to prevent dehydration or heat stroke. Work out until your breathing and your heartbeat get faster (moderate intensity). How often should I exercise? Choose an activity that you enjoy, and set realistic goals. Your health care provider can help you make an activity plan that is individually designed and works best for you. Exercise regularly as told by your health care provider. This may include: Doing strength training two times a week, such as: Lifting weights. Using resistance bands. Push-ups. Sit-ups. Yoga. Doing a certain intensity of exercise for a given amount of time. Choose from these options: A total of 150 minutes of moderate-intensity exercise every week. A total of 75 minutes of vigorous-intensity exercise every week. A mix of moderate-intensity and  vigorous-intensity exercise every week. Children, pregnant women, people who have not exercised regularly, people who are overweight, and older adults may need to talk with a health care provider about what activities are safe to perform. If you have a medical condition, be sure to talk with your health care provider before you start a new exercise program. What are some exercise ideas? Moderate-intensity exercise ideas include: Walking 1 mile (1.6 km) in about 15 minutes. Biking. Hiking. Golfing. Dancing. Water aerobics. Vigorous-intensity exercise ideas include: Walking 4.5 miles (7.2 km) or more in about 1 hour. Jogging or running 5 miles (8 km) in about 1 hour. Biking 10 miles (16.1 km) or more in about 1 hour. Lap swimming. Roller-skating or in-line skating. Cross-country skiing. Vigorous competitive sports, such as football, basketball, and soccer. Jumping rope. Aerobic dancing. What are some everyday activities that can help me get exercise? Yard work, such as: Child psychotherapist. Raking and bagging leaves. Washing your car. Pushing a stroller. Shoveling snow. Gardening. Washing windows or floors. How can I be more active in my day-to-day activities? Use stairs instead of an elevator. Take a walk during your lunch break. If you drive, park your car farther away from your work or school. If you take public transportation, get off one stop early and walk the rest of the way. Stand up or walk around during all of your indoor phone calls. Get up, stretch, and walk around every 30 minutes throughout the day. Enjoy exercise with a friend. Support to continue exercising will help you keep a regular routine of activity. Where to find more information You can find more information about exercising to stay healthy from: U.[place]. Department of Health and Human Services: ThisPath.fi Centers for Disease Control and Prevention (  CDC): FootballExhibition.com.br Summary Exercising regularly is  important. It will improve your overall fitness, flexibility, and endurance. Regular exercise will also improve your overall health. It can help you control your weight, reduce stress, and improve your bone density. Do not exercise so much that you hurt yourself, feel dizzy, or get very short of breath. Before you start a new exercise program, talk with your health care provider. This information is not intended to replace advice given to you by your health care provider. Make sure you discuss any questions you have with your health care provider. Document Revised: 03/30/2021 Document Reviewed: 03/30/2021 Elsevier Patient Education  2024 [employer].

## 2024-07-30 ENCOUNTER — Ambulatory Visit: Payer: Self-pay | Admitting: Nurse Practitioner

## 2024-07-31 LAB — CBC WITH DIFFERENTIAL/PLATELET
Basophils Absolute: 0.1 x10E3/uL (ref 0.0–0.2)
Basos: 1 %
EOS (ABSOLUTE): 0.5 x10E3/uL — ABNORMAL HIGH (ref 0.0–0.4)
Eos: 3 %
Hematocrit: 48.7 % (ref 37.5–51.0)
Hemoglobin: 15.7 g/dL (ref 13.0–17.7)
Immature Grans (Abs): 0.1 x10E3/uL (ref 0.0–0.1)
Immature Granulocytes: 1 %
Lymphocytes Absolute: 3.2 x10E3/uL — ABNORMAL HIGH (ref 0.7–3.1)
Lymphs: 22 %
MCH: 30.8 pg (ref 26.6–33.0)
MCHC: 32.2 g/dL (ref 31.5–35.7)
MCV: 96 fL (ref 79–97)
Monocytes Absolute: 1 x10E3/uL — ABNORMAL HIGH (ref 0.1–0.9)
Monocytes: 7 %
Neutrophils Absolute: 9.9 x10E3/uL — ABNORMAL HIGH (ref 1.4–7.0)
Neutrophils: 66 %
Platelets: 201 x10E3/uL (ref 150–450)
RBC: 5.09 x10E6/uL (ref 4.14–5.80)
RDW: 13 % (ref 11.6–15.4)
WBC: 14.8 x10E3/uL — ABNORMAL HIGH (ref 3.4–10.8)

## 2024-07-31 LAB — CMP14+EGFR
ALT: 45 IU/L — ABNORMAL HIGH (ref 0–44)
AST: 46 IU/L — ABNORMAL HIGH (ref 0–40)
Albumin: 3.8 g/dL — ABNORMAL LOW (ref 3.9–4.9)
Alkaline Phosphatase: 126 IU/L — ABNORMAL HIGH (ref 44–121)
BUN/Creatinine Ratio: 10 (ref 10–24)
BUN: 11 mg/dL (ref 8–27)
Bilirubin Total: 0.5 mg/dL (ref 0.0–1.2)
CO2: 22 mmol/L (ref 20–29)
Calcium: 8.9 mg/dL (ref 8.6–10.2)
Chloride: 103 mmol/L (ref 96–106)
Creatinine, Ser: 1.13 mg/dL (ref 0.76–1.27)
Globulin, Total: 3.5 g/dL (ref 1.5–4.5)
Glucose: 118 mg/dL — ABNORMAL HIGH (ref 70–99)
Potassium: 4.5 mmol/L (ref 3.5–5.2)
Sodium: 139 mmol/L (ref 134–144)
Total Protein: 7.3 g/dL (ref 6.0–8.5)
eGFR: 72 mL/min/1.73 (ref >59)

## 2024-07-31 LAB — LIPID PANEL
Chol/HDL Ratio: 4.6 ratio (ref 0.0–5.0)
Cholesterol, Total: 160 mg/dL (ref 100–199)
HDL: 35 mg/dL — ABNORMAL LOW (ref >39)
LDL Chol Calc (NIH): 94 mg/dL (ref 0–99)
Triglycerides: 178 mg/dL — ABNORMAL HIGH (ref 0–149)
VLDL Cholesterol Cal: 31 mg/dL (ref 5–40)

## 2024-07-31 LAB — THYROID PANEL WITH TSH
Free Thyroxine Index: 1.5 (ref 1.2–4.9)
T3 Uptake Ratio: 20 % — ABNORMAL LOW (ref 24–39)
T4, Total: 7.5 ug/dL (ref 4.5–12.0)
TSH: 4.12 u[IU]/mL (ref 0.450–4.500)

## 2024-07-31 LAB — VITAMIN D 25 HYDROXY (VIT D DEFICIENCY, FRACTURES): Vit D, 25-Hydroxy: 11.3 ng/mL — ABNORMAL LOW (ref 30.0–100.0)

## 2024-07-31 LAB — PSA, TOTAL AND FREE
PSA, Free Pct: 10 %
PSA, Free: 0.04 ng/mL
Prostate Specific Ag, Serum: 0.4 ng/mL (ref 0.0–4.0)

## 2024-09-08 DIAGNOSIS — L900 Lichen sclerosus et atrophicus: Secondary | ICD-10-CM | POA: Diagnosis not present

## 2024-09-08 DIAGNOSIS — L2089 Other atopic dermatitis: Secondary | ICD-10-CM | POA: Diagnosis not present

## 2024-10-05 ENCOUNTER — Telehealth: Payer: Self-pay | Admitting: Family Medicine

## 2024-10-05 ENCOUNTER — Other Ambulatory Visit: Payer: Self-pay

## 2024-10-05 DIAGNOSIS — J4489 Other specified chronic obstructive pulmonary disease: Secondary | ICD-10-CM

## 2024-10-05 MED ORDER — BUDESONIDE-FORMOTEROL FUMARATE 160-4.5 MCG/ACT IN AERO: 2.0000 | INHALATION_SPRAY | Freq: Two times a day (BID) | RESPIRATORY_TRACT | 1 refills | Status: AC

## 2024-10-05 NOTE — Telephone Encounter (Signed)
 Called and spoke with [name]. He states that he needs 90 day supply of Symbicort  sent to pharmacy. Sent to pharmacy and patient aware

## 2024-10-05 NOTE — Telephone Encounter (Signed)
 Rx  for 90 days sent to pharmacy per patients request

## 2024-10-05 NOTE — Telephone Encounter (Signed)
 Copied from CRM #[id number]. Topic: Clinical - Prescription Issue >> Oct 05, 2024 11:20 AM Corin V wrote: Reason for CRM: Patient wife stated [name] prescribed Breyna - 160mg  and sent it in as a 30 day supply. Insurance will only pay for a 90 day supply and they need the script updated.  Please send to:  [facility] [address], [place] - [phone] [place] [phone]

## 2024-10-22 ENCOUNTER — Other Ambulatory Visit: Payer: Self-pay | Admitting: *Deleted

## 2024-10-22 DIAGNOSIS — J441 Chronic obstructive pulmonary disease with (acute) exacerbation: Secondary | ICD-10-CM

## 2024-12-30 ENCOUNTER — Other Ambulatory Visit: Payer: Self-pay | Admitting: Nurse Practitioner

## 2024-12-30 DIAGNOSIS — J441 Chronic obstructive pulmonary disease with (acute) exacerbation: Secondary | ICD-10-CM
